# Patient Record
Sex: Female | Born: 1960 | Race: White | Hispanic: No
Health system: Midwestern US, Community
[De-identification: ages and names within clinical notes are randomized; demographics above are authoritative.]

---

## 2011-02-08 ENCOUNTER — Inpatient Hospital Stay

## 2011-02-15 ENCOUNTER — Inpatient Hospital Stay

## 2011-04-12 ENCOUNTER — Encounter

## 2011-04-13 ENCOUNTER — Encounter: Admit: 2011-04-13 | Discharge: 2011-04-13 | Payer: PRIVATE HEALTH INSURANCE

## 2012-06-12 DIAGNOSIS — R079 Chest pain, unspecified: Secondary | ICD-10-CM

## 2012-06-12 NOTE — Unmapped (Signed)
Pt to room 12 per squad with complaint of not feeling well today. Pt complains of nausea and feeling yucky. Pt states that she was laying in bed watching television and has a sudden (2 sec-per pt) pain from her right chest up to her jaw. Pt states that the pain was short but sharp and states that now she feels a little weird. Tired and nauseated.

## 2012-06-13 ENCOUNTER — Emergency Department: Admit: 2012-06-13 | Payer: PRIVATE HEALTH INSURANCE

## 2012-06-13 ENCOUNTER — Inpatient Hospital Stay
Admit: 2012-06-13 | Discharge: 2012-06-13 | Disposition: A | Discharge status: Home / Self Care | Payer: PRIVATE HEALTH INSURANCE

## 2012-06-13 LAB — DIFFERENTIAL
Basophils Absolute: 32 /uL (ref 0–200)
Basophils Relative: 0.4 % (ref 0.0–1.0)
Eosinophils Absolute: 405 /uL (ref 15–500)
Eosinophils Relative: 5 % (ref 0.0–8.0)
Lymphocytes Absolute: 3272 /uL (ref 850–3900)
Lymphocytes Relative: 40.4 % (ref 15.0–45.0)
Monocytes Absolute: 761 /uL (ref 200–950)
Monocytes Relative: 9.4 % (ref 0.0–12.0)
Neutrophils Absolute: 3629 /uL (ref 1500–7800)
Neutrophils Relative: 44.8 % (ref 40.0–80.0)

## 2012-06-13 LAB — BASIC METABOLIC PANEL
Anion Gap: 14 mmol/L (ref 3–16)
BUN: 18 mg/dL (ref 7–25)
CO2: 25 mmol/L (ref 21–33)
Calcium: 9.3 mg/dL (ref 8.6–10.2)
Chloride: 104 mmol/L (ref 98–110)
Creatinine: 0.8 mg/dL (ref 0.50–1.20)
GFR MDRD Af Amer: 91 See note.
GFR MDRD Non Af Amer: 75 See note.
Glucose: 103 mg/dL (ref 65–99)
Osmolality, Calculated: 298 mOsm/kg (ref 278–305)
Potassium: 4.2 mmol/L (ref 3.5–5.3)
Sodium: 143 mmol/L (ref 135–146)

## 2012-06-13 LAB — TROPONIN I 0 HOUR: Troponin I: 0.02 ng/mL (ref 0.00–0.05)

## 2012-06-13 LAB — CBC
Hematocrit: 40.8 % (ref 35.0–45.0)
Hemoglobin: 13.8 g/dL (ref 11.7–15.5)
MCH: 29.8 pg (ref 27.0–33.0)
MCHC: 33.8 g/dL (ref 32.0–36.0)
MCV: 88.3 fL (ref 80.0–100.0)
MPV: 7.1 fL (ref 7.5–11.5)
Platelets: 298 10*3/uL (ref 140–400)
RBC: 4.62 10*6/uL (ref 3.80–5.10)
RDW: 13.4 % (ref 11.0–15.0)
WBC: 8.1 10*3/uL (ref 3.8–10.8)

## 2012-06-13 LAB — TROPONIN I: Troponin I: <0.01 ng/mL (ref 0.00–0.05)

## 2012-06-13 LAB — NTPROBNP: NT Pro BNP: 124 pg/mL

## 2012-06-13 LAB — TROPONIN I 90 MIN: Troponin I: 0.03 ng/mL (ref 0.00–0.05)

## 2012-06-13 MED ORDER — ondansetron (ZOFRAN) 4 mg/2 mL injection 4 mg
4 | Freq: Once | INTRAMUSCULAR | Status: AC | PRN
Start: 2012-06-13 — End: 2012-06-13
  Administered 2012-06-13: 05:00:00 via INTRAVENOUS

## 2012-06-13 MED ORDER — aspirin tablet 325 mg
325 | Freq: Once | ORAL | Status: AC
Start: 2012-06-13 — End: 2012-06-13
  Administered 2012-06-13: 05:00:00 via ORAL

## 2012-06-13 MED ORDER — sodium chloride 0.9 % 500 mL bolus
Freq: Once | INTRAVENOUS | Status: AC
Start: 2012-06-13 — End: 2012-06-13
  Administered 2012-06-13: 05:00:00 via INTRAVENOUS

## 2012-06-13 MED ORDER — morphine injection 4 mg
4 | Freq: Once | INTRAMUSCULAR | Status: AC | PRN
Start: 2012-06-13 — End: 2012-06-13
  Administered 2012-06-13: 05:00:00 via INTRAVENOUS

## 2012-06-13 MED ORDER — nitroGLYCERIN (NITRO-BID) 2 % ointment 1 inch
2 | Freq: Once | TRANSDERMAL | Status: AC
Start: 2012-06-13 — End: 2012-06-13
  Administered 2012-06-13: 05:00:00 via TOPICAL

## 2012-06-13 MED ORDER — nitroGLYCERIN (NITROSTAT) SL tablet 0.4 mg
0.4 | SUBLINGUAL | Status: AC | PRN
Start: 2012-06-13 — End: 2012-06-13
  Administered 2012-06-13: 05:00:00 via SUBLINGUAL

## 2012-06-13 MED FILL — NITROSTAT 0.4 MG SUBLINGUAL TABLET: 0.4 0.4 mg | SUBLINGUAL | Qty: 1

## 2012-06-13 MED FILL — ONDANSETRON HCL (PF) 4 MG/2 ML INJECTION SOLUTION: 4 4 mg/2 mL | INTRAMUSCULAR | Qty: 2

## 2012-06-13 MED FILL — MORPHINE 4 MG/ML INJECTION SYRINGE: 4 4 mg/mL | INTRAMUSCULAR | Qty: 1

## 2012-06-13 MED FILL — SODIUM CHLORIDE 0.9 % INTRAVENOUS SOLUTION: INTRAVENOUS | Qty: 500

## 2012-06-13 MED FILL — NITRO-BID 2 % TRANSDERMAL OINTMENT: 2 2 % | TRANSDERMAL | Qty: 1

## 2012-06-13 MED FILL — ASPIRIN 325 MG TABLET: 325 325 MG | ORAL | Qty: 1

## 2012-06-13 NOTE — Unmapped (Signed)
Follow up with your PCP this week. You need to have a stress test as an outpatient. If chest pain returns or becomes more severe or if you have more trouble with your breathing or other concerns, please return to the ER.

## 2012-06-13 NOTE — Unmapped (Signed)
C/O discomfort in left upper chest that increase with movement of left arm- rate 3/10

## 2012-06-13 NOTE — Unmapped (Signed)
No change in pain after Nitro SL- states the pain increases slightly with deep breath

## 2012-06-13 NOTE — Unmapped (Signed)
Christie Short ED Note    Date of Service: 06/12/2012    Reason for Visit: Chest Pain      Patient History     HPI: 51 year old female with a recent history of esophageal problems, for which she is currently seeing a gastroenterologist, presents emergency room and complaining of chest pain.  The patient said that she was laying in bed when she had a sudden onset of severe sharp left-sided chest pain that radiated up to her left jaw.  It lasted for a couple of seconds and resolves spontaneously.  Since then, the patient has had residual tightness in the left side of her chest wrapping around toward her left back.  She rates this discomfort as a level of 3/10.  She feels like her breathing is a little bit tight, but not like her asthma.  This breathing abnormality has only been going on since she 1st noticed the pain.  The patient denies having any palpitations.  Denies any lightheadedness or syncope.  The patient says that she's had mild nausea associated all day long, preceding these chest pain symptoms.           Past Medical History   Diagnosis Date   ??? Anxiety        Past Surgical History   Procedure Date   ??? Hysterectomy    ??? Appendectomy    ??? Cholecystectomy    ??? Tubal ligation        Christie Short  reports that she has never smoked. She does not have any smokeless tobacco history on file. She reports that she drinks alcohol. She reports that she does not use illicit drugs.    Previous Medications    UNKNOWN TO PATIENT    Anxiety medication. PRN       Allergies:   Allergies as of 06/12/2012   ??? (No Known Allergies)       Review of Systems     ROS:  Please see HPI.  No lower extremity edema.  No calf tenderness.  No period of prolonged immobility, recent travel, recent surgery.  No cough or URI symptoms.  No fever.  No vomiting.  No abdominal pain. Other systems reviewed and negative.      Physical Exam     ED Triage Vitals   Vital Signs Group      Temp 06/12/12  2300 97.7 ??F (36.5 ??C)      Temp Source 06/12/12 2300 Oral      Heart Rate 06/12/12 2300 85       Heart Rate Source --       Resp 06/12/12 2300 18       BP 06/12/12 2300 171/96 mmHg      BP Location 06/12/12 2300 Right arm      BP Method 06/12/12 2300 Automatic      Patient Position 06/12/12 2300 Lying   SpO2 --    O2 Device --        General: Well-appearing female able to speak in complete sentences    HEENT: Head is normocephalic and atraumatic.  Pupils are equal round and reactive to light. Extraocular muscles are intact. Mucous membranes are moist.    Neck: supple.    Pulmonary:  Equal rise and fall the chest wall. No accessory muscle use.  Lungs are clear to auscultation bilaterally.  No wheezes rales or rhonchi.    Cardiac:  Regular rate and rhythm.  No murmurs rubs or gallops.    Abdomen:  Soft nontender.  No organomegaly.  No costovertebral angle tenderness.  Normal active bowel sounds.    Musculoskeletal: normal range of motion throughout bilateral upper and lower extremities.    Vascular:  2+ peripheral pulses bilaterally upper and lower extremities.    Skin: warm and dry.  No rashes.    Neuro:  Cranial nerves II through XII are grossly intact.  Sensation is intact to light touch in bilateral upper and lower extremities.  Strength is 5 out 5 bilaterally in upper and lower extremities.  Patient is observed to have a normal gait with no ataxia.    Psych: normal mood and affect.      Diagnostic Studies     I reviewed all studies done today.    Labs:    Please see electronic medical record for any tests performed in the ED .     Radiology:    Please see electronic medical record for any tests performed in the ED    EKG:    Indication chest pain, Rate 80 to, Rhythm sinus and normal, Interval normal and Axis normal.  No old EKG available for comparison.  Interpretation: Normal ECG.    Emergency Department Procedures         ED Course and MDM     Christie Short is a 51 y.o. female who presented to the emergency  department with Chest Pain    I reviewed all ED nursing notes.    The patient had a normal ECG.  Her chest x-ray and laboratory evaluation were unremarkable.  This included cardiac markers at time 0.0 and 3 hours with no interval change.  Given the patient's risk factor profile and her description of symptoms, I feel that the patient is safe for additional risk stratification as an outpatient.  She is scheduled to have an EGD in 2 days.  She is aware that she needs to have a stress test as well.    The patient has no risk factors for thromboembolic event.  My suspicion for infectious etiology of symptoms is low.  The patient is alert he had cholecystectomy and therefore my suspicion for biliary etiology of symptoms is low.    DISCHARGE DIAGNOSIS:  1. Chest pain        DISCHARGE MEDICATIONS:  New Prescriptions    No medications on file       (Please note the MDM and HPI sections of this note were completed with a voice recognition program)      Critical Care Time         Loralie Champagne, MD  06/13/12 [phone]

## 2012-06-13 NOTE — Unmapped (Signed)
Pt ambulatory with steady gait.

## 2015-03-22 ENCOUNTER — Ambulatory Visit: Payer: PRIVATE HEALTH INSURANCE

## 2015-03-22 ENCOUNTER — Inpatient Hospital Stay: Payer: PRIVATE HEALTH INSURANCE

## 2015-03-26 ENCOUNTER — Inpatient Hospital Stay: Admit: 2015-03-26 | Payer: PRIVATE HEALTH INSURANCE

## 2015-03-26 DIAGNOSIS — M4712 Other spondylosis with myelopathy, cervical region: Secondary | ICD-10-CM

## 2015-03-26 DIAGNOSIS — M47896 Other spondylosis, lumbar region: Secondary | ICD-10-CM

## 2017-10-11 ENCOUNTER — Inpatient Hospital Stay: Admit: 2017-10-11 | Payer: PRIVATE HEALTH INSURANCE | Attending: Medical

## 2017-10-11 ENCOUNTER — Ambulatory Visit: Admit: 2017-10-11 | Discharge: 2017-10-11 | Payer: PRIVATE HEALTH INSURANCE | Attending: Medical

## 2017-10-11 DIAGNOSIS — M5136 Other intervertebral disc degeneration, lumbar region: Secondary | ICD-10-CM

## 2017-10-11 DIAGNOSIS — M545 Low back pain, unspecified: Secondary | ICD-10-CM

## 2017-10-11 MED ORDER — methylPREDNISolone (MEDROL, PAK,) 4 mg tablet
4 | PACK | ORAL | 0 refills | Status: AC
Start: 2017-10-11 — End: ?

## 2017-10-11 NOTE — Unmapped (Signed)
Chief Complaint   Patient presents with   ??? Establish Care     Back pain       HPI:      Christie Short is a 57 y.o. female who presents for evaluation of low back pain and left buttock pain since December. States she has had back pain off and on for the past few months but it has worsened since December. Has noticed tingling in her left groin for the past couple days. Denies any leg pain. States she has tried massage which has helped. She started to notice the pain in the fall while golfing. 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 She has started swimming 2 weeks ago which has improved her left buttock pain/tingling. States the pain is constant and is worse with increased activity. The pain has not prevented her from doing activities but she notices increased pain at night. She has not tried PT. MRI of lumbar spine done in 2016 showed mild degenerative changes without neural compression.     Denies saddle anesthesia or loss of bowel or bladder.     Past Medical History:   Diagnosis Date   ??? Anxiety    ??? Asthma        Past Surgical History:   Procedure Laterality Date   ??? APPENDECTOMY     ??? CHOLECYSTECTOMY     ??? HYSTERECTOMY     ??? TUBAL LIGATION          History reviewed. No pertinent family history.    Medication  Current Outpatient Prescriptions   Medication Sig Dispense Refill   ??? nitrofurantoin (MACRODANTIN) 50 MG capsule Take by mouth.     ??? citalopram (CELEXA) 10 MG tablet Take by mouth.     ??? esomeprazole (NEXIUM) 20 MG capsule Take by mouth.     ??? estradiol 1.25 gram/actuation topical gel Place onto the skin.     ??? methylPREDNISolone (MEDROL, PAK,) 4 mg tablet follow package directions 21 Package 0   ??? UNKNOWN TO PATIENT Anxiety medication. PRN     ??? zolpidem (AMBIEN) 10 mg tablet        No current facility-administered medications for this visit.        Allergies  No Known Allergies    Review of Systems:  Pertinent items are noted in HPI.  A complete 10 system ROS was reviewed and was  otherwise negative for pertinent issues.    Physical Examination:    This is a pleasant female, alert, and in no acute distress.    Vitals:    10/11/17 0916   Weight: 180 lb (81.6 kg)   Height: 5' 5 (1.651 m)       Psychiatric:   The patient's mood is normal and appropriate for their visit     Musculoskeletal:     Lumbar Spine:  Gait:normal gait  Inspection and palpation: inspection of back is normal, no tenderness noted.  ROM exam: full range of motion with pain.  Straight leg raise: negative   Sensation is normal  Strength in LE: 5/5    Left HIP:  Logroll: negative  Tenderness: None  5/5 strength: HF/HE, Quad, Hamstring, TA, G/S  Sensation intact to light touch DP/SP/Sap/Sur/Tibial    Vascular exam:   Extremities warm and well perfused, no significant edema    Respiratory exam: Breathing easy and unlabored    Neuro exam:   No focal neuro deficits    Lymphatic:  No obvious lymphadenopathy    Skin:  Warm, dry    Radiology:  X-ray of lumbar spine showed dextro curvature with multilevel degenerative disc disease, moderate L3-4.    Assessment:     1. Lumbar spine pain  X-ray Lumbar spine 2 or 3-views    methylPREDNISolone (MEDROL, PAK,) 4 mg tablet    Physical Therapy       Plan:    Christie Short is a 57 y.o. female with low back and left buttock pain/tingling. Will prescribe medrol dosepak and order PT. If no improvement in 4 weeks then will plan on ordering MRI of lumbar spine for further evaluation.     Follow-up: after MRI if needed.  Sooner with any problems, questions, concerns, or worsening symptoms.    Toney Sang PA-C

## 2018-01-19 ENCOUNTER — Ambulatory Visit: Admit: 2018-01-19 | Discharge: 2018-01-19 | Payer: PRIVATE HEALTH INSURANCE | Attending: Sports Medicine

## 2018-01-19 ENCOUNTER — Inpatient Hospital Stay: Admit: 2018-01-19 | Payer: PRIVATE HEALTH INSURANCE | Attending: Sports Medicine

## 2018-01-19 DIAGNOSIS — M25572 Pain in left ankle and joints of left foot: Secondary | ICD-10-CM

## 2018-01-19 NOTE — Progress Notes (Signed)
MRI LEFT ANKLE SCHED AT [facility] [place] ON 01/22/18 AT 445PM - PATIENT REQUESTED LOCATION DUE TO COST - NO PRECERT REQUIRED

## 2018-01-19 NOTE — Progress Notes (Signed)
CHIEF COMPLAINT  Chief Complaint   Patient presents with    Establish Care     left foot/ankle     HISTORY OF PRESENT ILLNESS  Christie Short is a 57 y.o. female who presents today for evaluation of left foot pain.     Chronic lateral ankle pain that has mildly worsened recently. Patient swims and has bought shoe insert with reflexology points. No improvement in symptoms. Patient's symptoms are worsened after prolonged rest.     The symptoms have been present for 1 year(s).  The problem was a(n) gradual and insidious onset.    The patient did not have a specific injury.    The pain is moderate. The patient describes the pain as aching.  Current symptoms include: pain at the lateral aspect of the ankle  The following worsen the problem: walking  and weight bearing  The following improve the problem: OTC NSAIDS  Previous foot injury: no    Goals for recovery: pain reduction     PAST MEDICAL/SURGICAL HISTORY   The patient's past medical history, past surgical history, social history, medications, and allergies were reviewed and are documented in the patient's chart.      REVIEW OF SYSTEMS   Pertinent items are noted in HPI. A complete 10-system ROS was reviewed and was otherwise negative for pertinent issues.    PHYSICAL EXAMINATION  Vitals:    01/19/18 1237   Weight: 180 lb (81.6 kg)   Height: 5' 5 (1.651 m)    Body mass index is 29.95 kg/m.    Physical examination reveals a healthy-appearing 57 y.o. female in no acute distress. She has symmetric pulses and light touch sensation in her bilateral extremities.   There is not atrophy.  There is not bruising.  There is not deformity.  There is not swelling.  Skin inspection reveals: no skin or nail lesions; no thickening, discoloration or pitting  Overall foot inspection reveals: increased pronation on L   Rear examination of the foot reveals negative hindfoot valgus.  her gait was evaluated and found to have a normal gait.    Examination of the bilateral ankles reveals full  range of motion and strength. There are no areas of tenderness. The instability examination is negative.     Examination of the unaffected foot reveals full range of motion and strength. There are no areas of tenderness. The instability examination is negative.     There is tenderness to palpation over the peroneal tendon     Examination of the range of motion of the affected foot reveals:  - Normal toe flexion  - Normal toe extension  - Normal toe abduction    Strength examination of the affected foot reveals:   - Toe flexion: 5/5  - Toe extension: 5/5    IMAGING  AP/ LAT/ MORTISE VIEWS were obtained of the left foot. They were reviewed independently and discussed with the patient. They reveal no arthritic changes. There are not fracture(s) noted.    ASSESSMENT/PLAN  1. Pain in left ankle and joints of left foot  X-ray Foot Left min 3-views    X-ray Ankle Left min 3-views    MRI Ankle Left WO     We reviewed the above diagnosis with Olegario Messier today. her symptoms are most consistent with peroneal tendonitis vs longitudinal tendon tear. Possible cuboid stress fx. Will obtain MRI to further evaluate. Patient will likely require boot following imaging.    The treatment options were discussed in detail. The decision was  made to go forward with  - MRI L foot     Follow-up: in 1 week(s)  If there are any questions prior to this, the patient was instructed to contact the office.    Roque Cash DO   PGY4 ER SM Fellow

## 2018-01-19 NOTE — Unmapped (Signed)
This office note has been dictated.

## 2018-01-22 ENCOUNTER — Encounter: Attending: Sports Medicine

## 2018-01-23 ENCOUNTER — Ambulatory Visit: Admit: 2018-01-23 | Discharge: 2018-01-23 | Payer: PRIVATE HEALTH INSURANCE | Attending: Sports Medicine

## 2018-01-23 DIAGNOSIS — M25572 Pain in left ankle and joints of left foot: Secondary | ICD-10-CM

## 2018-01-23 NOTE — Unmapped (Signed)
This office note has been dictated.

## 2018-01-27 NOTE — Unmapped (Signed)
[facility]     PATIENT NAME:  Christie Short, Christie Short                    MRN:  [id number]  DATE OF BIRTH:  12-15-1960                       CSN:  [id number]  PROVIDER:  Lysbeth Galas, M.D.                VISIT DATE:  01/23/2018                                       OFFICE NOTE     CHIEF COMPLAINT:  Left ankle pain.     Frieda returns for evaluation. Her MRI showed no tears in the peroneus brevis  longus tendon. She does have some significant tenosynovitis, but no tear. 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 Not really any pain and I think she  has a relatively pes cavus foot and that is why she does not really feel that.     PHYSICAL EXAMINATION: On physical examination, you cannot palpate this at  all.  What we do see on her physical examination though is her peroneal  tendon is continuing to sublux.     We are going to have her follow-up with Dr. Durwin Nora to address the subluxation  of her peroneal tendon as well as the hemangioma.                                              Lysbeth Galas, M.D.  SWD/pd  D:  01/26/2018 08:11  T:  01/27/2018 16:01  Job #:  [id number]           OFFICE NOTE                                                  PAGE    1 of   1

## 2018-01-30 ENCOUNTER — Ambulatory Visit: Admit: 2018-01-30 | Discharge: 2018-01-30 | Payer: PRIVATE HEALTH INSURANCE

## 2018-01-30 DIAGNOSIS — M25572 Pain in left ankle and joints of left foot: Secondary | ICD-10-CM

## 2018-01-30 NOTE — Progress Notes (Signed)
CC: left ankle pain    HPI:      Christie Short is a 57 y.o. female who presents to me for the first time for evaluation of left ankle pain, ongoing for >1 year now. She denies any acute injury or trauma. However, patient [place] and has been walking on the sides of her feet walking uphill and mowing the lawn, suspects she might have injured her foot at some point. The ankle pain had mostly been occurring when she was off of her ankle, but lately she has been having throbbing pain with weightbearing. Still, the ankle pain at rest > pain with weightbearing. She also reports stabbing pain in her left ankle. She has taken Tylenol and advil with moderate relief. Recently, her left ankle started popping ~1 month ago with some relief after the popping but some burning pain also. She hiked this past weekend in boots. Patient is also active with swimming.     Patient reports she has rolled her right ankle numerous times. She is referred to me by Dr. Rubye Oaks.      Past Medical History:   Diagnosis Date    Anxiety     Asthma        Past Surgical History:   Procedure Laterality Date    APPENDECTOMY      CHOLECYSTECTOMY      HYSTERECTOMY      TUBAL LIGATION          No family history on file.    Medication  Current Outpatient Prescriptions   Medication Sig Dispense Refill    citalopram (CELEXA) 10 MG tablet Take by mouth.      esomeprazole (NEXIUM) 20 MG capsule Take by mouth.      estradiol 1.25 gram/actuation topical gel Place onto the skin.      methylPREDNISolone (MEDROL, PAK,) 4 mg tablet follow package directions 21 Package 0    nitrofurantoin (MACRODANTIN) 50 MG capsule Take by mouth.      UNKNOWN TO PATIENT Anxiety medication. PRN      zolpidem (AMBIEN) 10 mg tablet        No current facility-administered medications for this visit.        Allergies  No Known Allergies    Review of Systems:  The patient's medical history and review of systems was completed as part of her intake paperwork.   These forms were reviewed with the patient during the visit today, and have been scanned into electronic medical record.  Pertinent items are noted in HPI.     Physical Examination:    This is a pleasant female, alert, and in no acute distress.    Vitals:    01/30/18 0958   Weight: 170 lb (77.1 kg)   Height: 5' 5 (1.651 m)       Psychiatric:    The patient's mood is normal and appropriate for their visit      Vascular exam:    Extremities warm and well perfused, no significant edema      Palpable DP and PT pulses.     Respiratory exam:  Breathing easy and unlabored    Lymphatic:   No obvious lymphadenopathy    Skin:      Warm, dry    Gait:    Cautious gait on left, able to toe and heel walk     Hindfoot alignment:  Subtle cavus    MSK:    Mild tenderness over left peroneals, no  gross instability of peroneals, no significant discomfort with passive inversion/eversion.     Neuro exam:    Ankle dorsiflexion, plantarflexion, inversion, and eversion are intact.  Sensation intact to deep peroneal, superficial peroneal, sural, saphenous, and tibial nerve distributions.             Radiology:  MRI images of the left ankle dated 01/22/18 were reviewed independently and discussed with the patient.  The films revealed:   1. Supra- to inframalleolar peroneus brevis and longus tenosynovitis, without tear.  2. 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  3. Plantar calcaneal spur with high-grade stress reaction.  Low-grade stress reaction in the plantar aspect of the calcaneus, without fracture, however no evidence of active plantar fasciitis or tear.        Assessment:      1. Pain in left ankle and joints of left foot          Plan:     Christie Short is a 57 y.o. female with left ankle pain secondary to ankle instability. We discussed the diagnosis and potential treatment options. We discussed surgical intervention if she continues to  subluxate and has popping. At this time, the patient would like to try conservative management with bracing and supportive footwear. Continue NSAIDs prn. The patient voices understanding and all of her questions were answered. Follow up prn, when she is ready for surgery. It was a pleasure meeting the patient today in clinic.       This note was scribed by Suann Larry, scribe, in the presence of Christie Aver, MD, who reviewed this chart and validates that it is accurate and all medical decision making was done by them.     I, Christie Aver, MD validate that this chart was completed under my supervision.

## 2018-11-26 ENCOUNTER — Ambulatory Visit: Admit: 2018-11-26 | Discharge: 2018-11-26 | Payer: PRIVATE HEALTH INSURANCE | Attending: Sports Medicine

## 2018-11-26 ENCOUNTER — Inpatient Hospital Stay: Admit: 2018-11-26 | Discharge: 2018-12-04 | Payer: PRIVATE HEALTH INSURANCE | Attending: Sports Medicine

## 2018-11-26 DIAGNOSIS — M7541 Impingement syndrome of right shoulder: Secondary | ICD-10-CM

## 2018-11-26 DIAGNOSIS — M19011 Primary osteoarthritis, right shoulder: Secondary | ICD-10-CM

## 2018-11-26 MED ORDER — naproxen (NAPROSYN) 500 MG tablet
500 | ORAL_TABLET | Freq: Two times a day (BID) | ORAL | 0 refills | Status: AC
Start: 2018-11-26 — End: ?

## 2018-11-26 MED ORDER — naproxen (NAPROSYN) 500 MG tablet
500 | ORAL_TABLET | Freq: Two times a day (BID) | ORAL | 0 refills | Status: AC
Start: 2018-11-26 — End: 2018-11-26

## 2018-11-26 NOTE — Unmapped (Signed)
-   Decrease lap count, but continue other cardio exercises  - Begin physical therapy  - Prescription sent for Naprosyn

## 2018-11-26 NOTE — Unmapped (Signed)
Chief Complaint   Patient presents with   ??? Establish Care     right shoulder pain       HPI:      Christie Short is a 58 y.o. female who presents for evaluation of right shoulder pain.  The patient is seen at the request of Referral, Self.      The notes and documentation from the physician extender were reviewed, verified, and edited if necessary.       Patient comes in for evaluation of right shoulder pain that has been present for months, no known injury or trauma. The pain is over the anterior right shoulder, she endorses some popping in her shoulder. She has pain while swimming, especially during backstroke and during the reach phase of a stroke. Using Advil with good relief. Patient is right hand dominant. She is also active with golfing. She moved in the last few months and lifted a lot of boxes during the move.      Past Medical History:   Diagnosis Date   ??? Anxiety    ??? Asthma        Past Surgical History:   Procedure Laterality Date   ??? APPENDECTOMY     ??? CHOLECYSTECTOMY     ??? HYSTERECTOMY     ??? TUBAL LIGATION          History reviewed. No pertinent family history.    Medication  Current Outpatient Medications   Medication Sig Dispense Refill   ??? citalopram (CELEXA) 10 MG tablet Take by mouth.     ??? esomeprazole (NEXIUM) 20 MG capsule Take by mouth.     ??? estradiol 1.25 gram/actuation topical gel Place onto the skin.     ??? methylPREDNISolone (MEDROL, PAK,) 4 mg tablet follow package directions 21 Package 0   ??? nitrofurantoin (MACRODANTIN) 50 MG capsule Take by mouth.     ??? UNKNOWN TO PATIENT Anxiety medication. PRN     ??? zolpidem (AMBIEN) 10 mg tablet        No current facility-administered medications for this visit.        Allergies  No Known Allergies    Review of Systems:  Pertinent items are noted in HPI.  A complete 10 system ROS was reviewed and was otherwise negative for pertinent issues.    Physical Examination:    This is a pleasant female, alert, and in no acute distress.    Vitals:    11/26/18  1350   Weight: 170 lb (77.1 kg)   Height: 5' 5 (1.651 m)       Psychiatric:   The patient's mood is normal and appropriate for their visit     Right shoulder:  Full ROM with pain at > 90 elevation and abduction, +Hawkins', +Cross arm, decreased strength with rotator cuff testing, equivocal O'Brien's     Vascular exam:   Extremities warm and well perfused, no significant edema    Respiratory exam: Breathing easy and unlabored    Neuro exam:   No focal neuro deficits    Lymphatic:  No obvious lymphadenopathy    Skin:     Warm, dry    Radiology:     4 view X-rays of the right shoulder dated 11/26/18 were reviewed independently and discussed with the patient.  The films revealed: No acute osseous abnormalities, Moderate acromioclavicular joint arthrosis.    Assessment:     1. Right shoulder pain, unspecified chronicity  X-ray Shoulder Right min 2-views  Plan:    INIS BORNEMAN is a 58 y.o. female with right shoulder pain consistent with impingement. We discussed the diagnosis and potential treatment options. Referral to physical therapy for rotator cuff strengthening. Rx sent for naprosyn. The patient voices understanding and all of her questions were answered.    Discussed the patient's diagnosis, exam, imaging (if applicable), and treatment options:  [x]   Discussed return to activity considerations, including the need to avoid exacerbating activity, high impact, or painful activity  [x]   Discussed the use of pain medication and nonsteroidal anti-inflammatory agents including the risk of side effects which include but not limited to:  the risk of increased blood pressure, bleeding, renal, gastrointestinal, and heart involvement.     [x]  Patient to consider ibuprofen or naprosyn for 1-2 weeks with food.     [x]  The patient can also consider acetaminophen for additional pain if needed.    [x]  Prescription given for pain medication or anti-inflammatory (see Epic orders)   []  May continue on their previously prescribed  pain regimen   [x]   Ice 3-4 x/day for 15-20 min and after activity  []   Consider glucosamine supplementation - 1500 mg/day for 2-3 months    []   Weight management   []   Consider brace  [x]   Reviewed the role of a rehabilitative exercise program:   [x]  Patient elected for a referral to physical therapy     []  Patient elected for a home based program and instruction was given in the office today  [x]   Reviewed the role of additional diagnostic and treatment options including the role of further imaging, injection therapy, and possible surgical considerations.      Follow-up: 6 weeks.  Sooner with any problems, questions, concerns, or worsening symptoms.      [x]   A copy of my note was sent electronically to the referring provider through our shared medical record or automated fax system.    This note was scribed by Sandy Salaam, scribe, in the presence of Christie Patter, MD who reviewed this chart and validates that it is accurate and all medical decision making was done by them.     I, Christie Patter, MD validate that this chart was completed under my supervision.     The scribe???s documentation has been prepared under my direction and personally reviewed by me in its entirety. I confirm that the note above accurately reflects all work, treatment, procedures, and medical decision making performed by me.

## 2019-01-14 ENCOUNTER — Encounter: Payer: PRIVATE HEALTH INSURANCE | Attending: Sports Medicine

## 2019-01-31 NOTE — Unmapped (Signed)
Ok to cancel.  Thanks

## 2019-01-31 NOTE — Unmapped (Signed)
Talk to pt regarding rescheduling.she said she didn't went to her physical therapy but she is doing well. Her shoulder hasn't been bothering her lately. She would like to know if she need to be seen or not.

## 2019-05-01 ENCOUNTER — Ambulatory Visit
Admit: 2019-05-01 | Discharge: 2019-05-01 | Payer: PRIVATE HEALTH INSURANCE | Attending: Female Pelvic Medicine and Reconstructive Surgery

## 2019-05-01 DIAGNOSIS — Z87440 Personal history of urinary (tract) infections: Secondary | ICD-10-CM

## 2019-05-01 LAB — POCT URINALYSIS DIPSTICK
Bilirubin, UA: NEGATIVE
Blood, UA POC: NEGATIVE
Glucose, UA POC: NEGATIVE
Ketones, UA: NEGATIVE
Leukocytes, UA: NEGATIVE
Nitrite, UA: NEGATIVE
Protein, UA POC: NEGATIVE
Spec Grav, UA: 1.005
Urobilinogen, UA: NEGATIVE
pH, UA: 7.5

## 2019-05-01 MED ORDER — ESTROGENS, CONJUGATED 0.625 MG/GM VA CREA
0.625 MG/GM | VAGINAL | 0 refills | Status: DC
Start: 2019-05-01 — End: 2021-05-31

## 2019-05-01 NOTE — Telephone Encounter (Signed)
Faxed PT referral 3:27 pm 05/01/19    JB

## 2019-05-01 NOTE — Progress Notes (Signed)
05/01/2019       HPI:     Name: Diane Woodward  Date of Birth: 07-30-1961    CC: Patient is a 58 y.o. presenting for evaluation of frequent UTI by Dr Juliene Pina.  HPI: How long have you had this problem?  Years, worse the last 8 weeks   Please rate the severity of your problem: moderately severe  Anything make it better? Advil, tylenol, OTC ASO     She has been using macrobid for prophylaxis after intercourse, now she has pressure and pain I the pelvic area.  She did have a recent scan at [facility] that was normal according to the patient.  The pain would wake her up in the middle of the night.  Intercourse and alcohol make it worse.    CT from 2015  CT ABDOMEN AND PELVIS WITH CONTRAST:    CLINICAL INDICATION: N fraction    TECHNIQUE: Routine abdomen/pelvis CT with intravenous contrast. MIP urograms performed.. Oral contrast was administered.    COMPARISON: None    FINDINGS:    The lung bases are clear. Heart size normal without pericardial effusion.  The liver is unremarkable, without focal lesion. The spleen is not enlarged. There is no focal splenic lesion. The pancreas is unremarkable, without ductal dilatation.    The patient is status post cholecystectomy. The adrenals are within normal limits. The kidneys are within normal limits. No suspicious renal lesion is identified.    There is a faint area of high density 7 x 3 mm in the mid left kidney which could represent faint calcifications within a calyx or papilla.    There is no free pelvic fluid. There is no evidence of lymphadenopathy.    Bowel is grossly unremarkable. 6.4 cm low-attenuation rounded mass left adnexa.    Bone windows demonstrate no lytic or blastic lesion.    IMPRESSION      Mid left kidney there is a faint ill-defined area of high density 7 x 3 mm. It could represent faint calcifications within a calyx or patella.    No evidence of obstructive uropathy. Collecting systems normal. No renal mass lesions.    6.4 cm low-attenuation mass left adnexa.  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 Consider pelvic ultrasound for followup.   Ob/Gyn History:    OB History   No obstetric history on file.     Past Medical History: No past medical history on file.  Past Surgical History:   Past Surgical History:   Procedure Laterality Date   ??? APPENDECTOMY  2014   ??? BREAST REDUCTION SURGERY  07/2018   ??? CHOLECYSTECTOMY  2012   ??? HYSTERECTOMY, TOTAL ABDOMINAL      has had 3 seperate surgerys    ??? INCONTINENCE SURGERY  07/2005     Allergies: Allergies not on file  Current Medications:  Current Outpatient Medications   Medication Sig Dispense Refill   ??? conjugated estrogens (PREMARIN) 0.625 MG/GM vaginal cream Apply pea size amount of cream with index finger to the vagina two times per week 42.5 g 0     No current facility-administered medications for this visit.      Social History:   Social History     Socioeconomic History   ??? Marital status: Divorced     Spouse name: Not on file   ??? Number of children: Not on file   ??? Years of education: Not on file   ??? Highest education level: Not on file  Occupational History   ??? Not on file   Social Needs   ??? Financial resource strain: Not on file   ??? Food insecurity     Worry: Not on file     Inability: Not on file   ??? Transportation needs     Medical: Not on file     Non-medical: Not on file   Tobacco Use   ??? Smoking status: Never Smoker   ??? Smokeless tobacco: Never Used   Substance and Sexual Activity   ??? Alcohol use: Yes     Frequency: Monthly or less   ??? Drug use: Never   ??? Sexual activity: Yes     Partners: Male   Lifestyle   ??? Physical activity     Days per week: Not on file     Minutes per session: Not on file   ??? Stress: Not on file   Relationships   ??? Social Wellsite geologistconnections     Talks on phone: Not on file     Gets together: Not on file     Attends religious service: Not on file     Active member of club or organization: Not on file     Attends meetings of clubs or organizations: Not on file      Relationship status: Not on file   ??? Intimate partner violence     Fear of current or ex partner: Not on file     Emotionally abused: Not on file     Physically abused: Not on file     Forced sexual activity: Not on file   Other Topics Concern   ??? Not on file   Social History Narrative   ??? Not on file     Family History: No family history on file.  Review of System   Review of Systems   Eyes: Positive for itching.   Gastrointestinal: Positive for nausea.   Genitourinary: Positive for dysuria, pelvic pain and vaginal pain.   Allergic/Immunologic: Positive for environmental allergies.   Neurological: Positive for headaches.   All other systems reviewed and are negative.    POPQ and Pelvic Exam    Anterior Wall (Aa): 1 Anterior Wall (Ba): 1 Cervix or Cuff (C): 6   Genital Haitus (gh): 3 Perineal body (pb): 2 Total Vaginal Length (tvl): 9   Posterior Wall (Ap): 1 Posterior Wall (Bp): 1 Posterior Fornix (D): N/A       Objective:     Vitals  Vitals:    05/01/19 0910   BP: 136/87   Site: Left Upper Arm   Position: Sitting   Cuff Size: Medium Adult   Pulse: 74   Resp: 16   Temp: 96.7 ??F (35.9 ??C)   TempSrc: Temporal   SpO2: 98%   Weight: 175 lb (79.4 kg)   Height: 5\' 6"  (1.676 m)     Physical Exam  Physical Exam  HENT:      Head: Normocephalic and atraumatic.   Eyes:      Conjunctiva/sclera: Conjunctivae normal.   Neck:      Musculoskeletal: Normal range of motion and neck supple.   Pulmonary:      Effort: Pulmonary effort is normal.   Abdominal:      Palpations: Abdomen is soft.   Skin:     General: Skin is warm and dry.   Neurological:      Mental Status: She is alert and oriented to person, place, and time.  Results for POC orders placed in visit on 05/01/19   POCT Urinalysis no Micro   Result Value Ref Range    Color, UA Yellow     Clarity, UA Clear     Glucose, UA POC negative     Bilirubin, UA negative     Ketones, UA negative     Spec Grav, UA 1.005     Blood, UA POC negative     pH, UA 7.5     Protein, UA POC  negative     Urobilinogen, UA negative     Leukocytes, UA negative     Nitrite, UA negative    Office Fill Study/Urine Dip  Using sterile technique a manometry catheter was placed. Patient's bladder was filled with sterile water by gravity. Capacity and storage volumes were measured. Spasms assessed. Catheter was removed. Stress urinary incontinence was assessed.    WBC: negative  RBC: negative  Nitrate: negative    PVR: 10 cc  1st Sensation: 60 cc  2nd Sensation: 240 cc  Max Sensation: 310 cc  Empty Cough Stress Test: negative  Full Cough Stress Test: negative  Spasms: No    Assessment/Plan:     Diane Woodward is a 58 y.o. female with   1. History of recurrent UTI (urinary tract infection)    2. Vaginal atrophy    3. Dysuria    4. Levator spasm    5. Dyspareunia in female      By her history it sounds like she has had recurrent urinary tract infections.  She is treated herself with self start Macrobid therapy and occasionally has called in for other types of antibiotics for treatment.  Recently she is complained of this heaviness and pain in the pelvic floor area.  She does have some vaginal atrophy on examination, there is no sign of prolapse, and her pelvic floor muscles are tender.  She has agreed to try estrogen vaginal cream to follow-up with pelvic floor physical therapy, and to follow-up with me for cystourethroscopy in approximately 3 weeks.    Orders Placed This Encounter   Procedures   ??? POCT Urinalysis no Micro   ??? PR SIMPLE CYSTOMETROGRAM     Orders Placed This Encounter   Medications   ??? conjugated estrogens (PREMARIN) 0.625 MG/GM vaginal cream     Sig: Apply pea size amount of cream with index finger to the vagina two times per week     Dispense:  42.5 g     Refill:  0           Diane Woodward   I, Diane Woodward am scribing for and in the presence and direction of Dr. Tia MaskerSteven Julieana Eshleman.  05/01/2019   Rance MuirMeagan Moore, LPN  I, Dr. Tia MaskerSteven Doyle Tegethoff, personally performed the services described in this documentation  as scribed by the clinical staff in my presence, and it is both accurate and complete.

## 2019-05-29 ENCOUNTER — Encounter: Attending: Female Pelvic Medicine and Reconstructive Surgery

## 2019-06-26 ENCOUNTER — Encounter: Attending: Female Pelvic Medicine and Reconstructive Surgery

## 2020-10-26 ENCOUNTER — Ambulatory Visit: Admit: 2020-10-26 | Discharge: 2020-10-26 | Payer: PRIVATE HEALTH INSURANCE | Attending: Nurse Practitioner

## 2020-10-26 DIAGNOSIS — R3915 Urgency of urination: Secondary | ICD-10-CM

## 2020-10-26 LAB — POCT URINALYSIS DIPSTICK
Bilirubin, UA: NEGATIVE
Blood, UA POC: NEGATIVE
Glucose, UA POC: NEGATIVE
Ketones, UA: NEGATIVE
Leukocytes, UA: NEGATIVE
Nitrite, UA: NEGATIVE
Protein, UA POC: NEGATIVE
Spec Grav, UA: 1.015
Urobilinogen, UA: NEGATIVE
pH, UA: 6.5

## 2020-10-26 MED ORDER — NITROFURANTOIN MONOHYD MACRO 100 MG PO CAPS
100 MG | ORAL_CAPSULE | Freq: Every day | ORAL | 1 refills | Status: AC
Start: 2020-10-26 — End: 2020-11-23

## 2020-10-26 NOTE — Progress Notes (Signed)
Patient has been seen once on referral from her PCP for recurrent UTI's in 04/2019.  Advised to have cystoscopy.    Review of epic shows some reoccuring UTI messages to her PCP for treatment as she is often out of town at her home in [place].   10/05/20  (culture obtained)  08/07/20  12/31/19    Typically treated with Bactrim over the phone.     She is currently using topical vaginal estrogen sporadically and cranberry supplement sporadically

## 2020-10-26 NOTE — Progress Notes (Signed)
10/26/2020       HPI:     Name: Diane Woodward  Date of Birth: May 13, 1961    CC: Patient is a 60 y.o. presenting for evaluation of urine check for UTIs.   HPI: How long have you had this problem?  months  Please rate the severity of your problem: moderate  Anything make it better? Antibiotics     Patient has been seen once on referral from her PCP for recurrent UTI's in 04/2019.  Advised to have cystoscopy.    Review of epic shows some reoccuring UTI messages to her PCP for treatment as she is often out of town at her home in [place].   10/05/20  (culture obtained) Completed Keflex  08/07/20  12/31/19    Typically treated with Bactrim over the phone.     She is currently using topical vaginal estrogen sporadically and cranberry supplement sporadically    Feels she continues to get them with intercourse  Symptoms include U/F, hesitation of flow, dysuria          Ob/Gyn History:    OB History   No obstetric history on file.     Past Medical History: No past medical history on file.  Past Surgical History:   Past Surgical History:   Procedure Laterality Date   ??? APPENDECTOMY  2014   ??? BREAST REDUCTION SURGERY  07/2018   ??? CHOLECYSTECTOMY  2012   ??? HYSTERECTOMY, TOTAL ABDOMINAL      has had 3 seperate surgerys    ??? INCONTINENCE SURGERY  07/2005     Allergies: No Known Allergies  Current Medications:  Current Outpatient Medications   Medication Sig Dispense Refill   ??? acyclovir (ZOVIRAX) 400 MG tablet Take 400 mg by mouth 3 times daily     ??? amLODIPine (NORVASC) 2.5 MG tablet Take 2.5 mg by mouth daily     ??? citalopram (CELEXA) 10 MG tablet Take by mouth     ??? esomeprazole (NEXIUM) 20 MG delayed release capsule Take by mouth     ??? zolpidem (AMBIEN) 10 MG tablet      ??? nitrofurantoin, macrocrystal-monohydrate, (MACROBID) 100 MG capsule Take 1 capsule by mouth daily for 28 days 28 capsule 1   ??? Estradiol 0.75 MG/1.25 GM (0.06%) GEL Place onto the skin (Patient not taking: Reported on 10/26/2020)     ??? fexofenadine (ALLEGRA) 180 MG  tablet Take 180 mg by mouth daily (Patient not taking: Reported on 10/26/2020)     ??? fluconazole (DIFLUCAN) 150 MG tablet TAKE ONE TABLET BY MOUTH ONCE (Patient not taking: Reported on 10/26/2020)     ??? fluticasone (FLONASE) 50 MCG/ACT nasal spray 2 sprays by Nasal route daily (Patient not taking: Reported on 10/26/2020)     ??? olopatadine (PATANASE) 0.6 % SOLN nassl soln 1 spray by Nasal route daily (Patient not taking: Reported on 10/26/2020)     ??? conjugated estrogens (PREMARIN) 0.625 MG/GM vaginal cream Apply pea size amount of cream with index finger to the vagina two times per week (Patient not taking: Reported on 10/26/2020) 42.5 g 0     No current facility-administered medications for this visit.     Social History:   Social History     Socioeconomic History   ??? Marital status: Divorced     Spouse name: Not on file   ??? Number of children: Not on file   ??? Years of education: Not on file   ??? Highest education level: Not on file  Occupational History   ??? Not on file   Tobacco Use   ??? Smoking status: Never Smoker   ??? Smokeless tobacco: Never Used   Substance and Sexual Activity   ??? Alcohol use: Yes   ??? Drug use: Never   ??? Sexual activity: Yes     Partners: Male   Other Topics Concern   ??? Not on file   Social History Narrative   ??? Not on file     Social Determinants of Health     Financial Resource Strain:    ??? Difficulty of Paying Living Expenses: Not on file   Food Insecurity:    ??? Worried About Running Out of Food in the Last Year: Not on file   ??? Ran Out of Food in the Last Year: Not on file   Transportation Needs:    ??? Lack of Transportation (Medical): Not on file   ??? Lack of Transportation (Non-Medical): Not on file   Physical Activity:    ??? Days of Exercise per Week: Not on file   ??? Minutes of Exercise per Session: Not on file   Stress:    ??? Feeling of Stress : Not on file   Social Connections:    ??? Frequency of Communication with Friends and Family: Not on file   ??? Frequency of Social Gatherings with Friends  and Family: Not on file   ??? Attends Religious Services: Not on file   ??? Active Member of Clubs or Organizations: Not on file   ??? Attends Banker Meetings: Not on file   ??? Marital Status: Not on file   Intimate Partner Violence:    ??? Fear of Current or Ex-Partner: Not on file   ??? Emotionally Abused: Not on file   ??? Physically Abused: Not on file   ??? Sexually Abused: Not on file   Housing Stability:    ??? Unable to Pay for Housing in the Last Year: Not on file   ??? Number of Places Lived in the Last Year: Not on file   ??? Unstable Housing in the Last Year: Not on file     Family History: No family history on file.  Review of System   Review of Systems   Genitourinary: Positive for enuresis and frequency.   Allergic/Immunologic: Positive for environmental allergies.   All other systems reviewed and are negative.      A review of systems was done by the patient and reviewed by me.    Objective:     Vitals  Vitals:    10/26/20 1451   BP: (!) 151/82   Pulse: 78   Resp: 18   Temp: 97.2 ??F (36.2 ??C)   SpO2: 98%     Physical Exam  Physical Exam  Vitals and nursing note reviewed.   Constitutional:       Appearance: Normal appearance.   HENT:      Head: Normocephalic.   Eyes:      Conjunctiva/sclera: Conjunctivae normal.   Cardiovascular:      Rate and Rhythm: Normal rate.   Pulmonary:      Effort: Pulmonary effort is normal.   Musculoskeletal:         General: Normal range of motion.      Cervical back: Normal range of motion.   Skin:     General: Skin is warm and dry.   Neurological:      General: No focal deficit present.  Mental Status: She is alert.   Psychiatric:         Mood and Affect: Mood normal.         Behavior: Behavior normal.       Straight urinary catheterization performed by sterile procedure for urine specimen per Drema Balzarine, NP.  Patient tolerated well.  Drema Balzarine, NP made aware.       Results for POC orders placed in visit on 10/26/20   POCT Urinalysis no Micro   Result Value Ref Range     Color, UA yellow     Clarity, UA clear     Glucose, UA POC neg     Bilirubin, UA neg     Ketones, UA neg     Spec Grav, UA 1.015     Blood, UA POC neg     pH, UA 6.5     Protein, UA POC neg     Urobilinogen, UA neg     Leukocytes, UA neg     Nitrite, UA neg        Assessment/Plan:     Diane Woodward is a 60 y.o. female with   1. Urgency of urination    2. Recurrent UTI    3. Urinary frequency      -Records reviewed.  -Release of records obtained for recent Urine culture from Urgent care in [place]    -Recurrent UTI:  Diagnosis and management of recurrent UTI reviewed with patient.  Additional benefits of twice daily cranberry, d-mannose and methenamine discussed  Aware of role for daily or self-start antibiotics when cultures are reviewed  Informed how post and perimenopausal women are more susceptible to UTI's due to lack of estrogen    Understands the need for culture if symptomatic ??? outlined advantage of cath specimen  Also counseled on need for cystoscopy and patient will schedule  Patient to return here for UTI symptoms or have culture obtained.    -Macrobid sent to pharmacy for post coital use.  -she will be more consistent at use of vaginal estrogen and cranberry.     -Urine sent for culture today.    Follow up cysto 1-2 months  Carlena Bjornstad, APRN - CNP    Orders Placed This Encounter   Procedures   ??? Culture, Urine     Order Specific Question:   Specify (ex-cath, midstream, cysto, etc)?     Answer:   straight cath   ??? POCT Urinalysis no Micro   ??? 51701 - PR INSERT,NON-INDWELLING BLADDER CATHETER     Orders Placed This Encounter   Medications   ??? nitrofurantoin, macrocrystal-monohydrate, (MACROBID) 100 MG capsule     Sig: Take 1 capsule by mouth daily for 28 days     Dispense:  28 capsule     Refill:  1       Carlena Bjornstad, APRN - CNP

## 2020-10-29 LAB — CULTURE, URINE: Urine Culture, Routine: NO GROWTH

## 2020-12-21 ENCOUNTER — Ambulatory Visit: Admit: 2020-12-21 | Discharge: 2020-12-21 | Payer: PRIVATE HEALTH INSURANCE | Attending: Sports Medicine

## 2020-12-21 ENCOUNTER — Inpatient Hospital Stay: Admit: 2020-12-21 | Payer: PRIVATE HEALTH INSURANCE | Attending: Sports Medicine

## 2020-12-21 DIAGNOSIS — M7742 Metatarsalgia, left foot: Secondary | ICD-10-CM

## 2020-12-21 NOTE — Unmapped (Signed)
Chief Complaint   Patient presents with   ??? Foot Pain     left foot        HPI:      Christie Short is a 60 y.o. female with a history of left ankle instability (previously seen by Dr. Durwin Nora) who presents for evaluation of left foot pain. The patient states that the pain located at the bast of her 2nd toe onset about 1 month ago. She first noticed the pain when returning home in the evening from a game of golf. She notes that her 2nd and 3rd toes seem more separated than usual. She has occasionally been taking Aleve with some relief; otherwise she has not been doing anything else for pain.      The notes and documentation from the physician extender were reviewed, verified, and edited if necessary.    Social History:     The patient notes that she has been spending a lot of time in [place] and is renovating a home in [place]. She will be in [place] again in May.    Past Medical History:   Diagnosis Date   ??? Anxiety    ??? Asthma        Past Surgical History:   Procedure Laterality Date   ??? APPENDECTOMY     ??? CHOLECYSTECTOMY     ??? HYSTERECTOMY     ??? TUBAL LIGATION          History reviewed. No pertinent family history.    Medication  Current Outpatient Medications   Medication Sig Dispense Refill   ??? citalopram (CELEXA) 10 MG tablet Take by mouth.     ??? esomeprazole (NEXIUM) 20 MG capsule Take by mouth.     ??? estradiol 1.25 gram/actuation topical gel Place onto the skin.     ??? methylPREDNISolone (MEDROL, PAK,) 4 mg tablet follow package directions 21 Package 0   ??? naproxen (NAPROSYN) 500 MG tablet Take 1 tablet (500 mg total) by mouth 2 times a day with meals. 60 tablet 0   ??? nitrofurantoin (MACRODANTIN) 50 MG capsule Take by mouth.     ??? UNKNOWN TO PATIENT Anxiety medication. PRN     ??? zolpidem (AMBIEN) 10 mg tablet        No current facility-administered medications for this visit.       Allergies  No Known Allergies    Review of Systems:  Pertinent items are noted in HPI.  A complete 10 system ROS was reviewed and  was otherwise negative for pertinent issues.    Physical Examination:    This is a pleasant female, alert, and in no acute distress.    Vitals:    12/21/20 0831   Weight: 182 lb (82.6 kg)   Height: 5' 6 (1.676 m)       Psychiatric:   The patient's mood is normal and appropriate for their visit     Left foot exam: TTP over the 2nd metatarsal head. She has a slight hammertoe deformity. Neurovascularly intact.    Vascular exam:   Extremities warm and well perfused, no significant edema    Respiratory exam: Breathing easy and unlabored    Neuro exam:   No focal neuro deficits    Lymphatic:  No obvious lymphadenopathy    Skin:     Warm, dry    Radiology:     3 view X-rays of the left foot dated 12/21/20 were reviewed independently and discussed with the patient.  The films revealed:  no acute osseous abnormalities.    Assessment:     1. Metatarsalgia of left foot     2. Left foot pain  X-ray Foot Left min 3-views       Plan:     Christie Short is a 60 y.o. female with left second toe metatarsalgia. We discussed the diagnosis and potential treatment options. I recommended that she try a metatarsal pad; I recommended Powerstep pinnacle plus met insoles (which have a metatarsal pad built in). The patient voices understanding and all of her questions were answered.     Discussed the patient's diagnosis, exam, imaging (if applicable), and treatment options:  [x]   Discussed return to activity considerations, including the need to avoid exacerbating activity, high impact, or painful activity  [x]   Discussed the use of pain medication and nonsteroidal anti-inflammatory agents including the risk of side effects which include but not limited to:  the risk of increased blood pressure, bleeding, renal, gastrointestinal, and heart involvement.     [x]  Patient to consider ibuprofen or naprosyn for 1-2 weeks with food.     [x]  The patient can also consider acetaminophen for additional pain if needed.    []  Prescription medication  prescribed today (see Epic orders)   [x]  May continue on their previously prescribed medications  [x]   Ice 3-4 x/day for 15-20 min and after activity  []   Consider glucosamine supplementation - 1500 mg/day for 2-3 months    []   Weight management   []   Consider brace  [x]   Reviewed the role of a rehabilitative exercise program:   []  Patient elected for a referral to physical therapy     []  Patient elected for a home based program and instruction was given in the office today  [x]   Reviewed the role of additional diagnostic and treatment options including the role of further imaging, injection therapy, and possible surgical considerations.      Follow-up: 6 weeks to reassess.  Sooner with any problems, questions, concerns, or worsening symptoms.          This note was scribed by Milus Mallick , in the presence of Laverle Patter, MD, who reviewed this chart and validates that it is accurate and all medical decision making was done by them.     I, Laverle Patter, MD validate that this chart was completed under my supervision.     The scribe???s documentation has been prepared under my direction and personally reviewed by me in its entirety. I confirm that the note above accurately reflects all work, treatment, procedures, and medical decision making performed by me.

## 2020-12-24 ENCOUNTER — Encounter: Attending: Female Pelvic Medicine and Reconstructive Surgery

## 2021-01-26 ENCOUNTER — Encounter
Admit: 2021-01-26 | Discharge: 2021-01-26 | Payer: PRIVATE HEALTH INSURANCE | Attending: Female Pelvic Medicine and Reconstructive Surgery

## 2021-01-26 DIAGNOSIS — N390 Urinary tract infection, site not specified: Secondary | ICD-10-CM

## 2021-01-26 MED ORDER — NITROFURANTOIN MONOHYD MACRO 100 MG PO CAPS
100 MG | ORAL_CAPSULE | Freq: Two times a day (BID) | ORAL | 1 refills | Status: DC
Start: 2021-01-26 — End: 2022-01-17

## 2021-01-26 NOTE — Progress Notes (Signed)
01/26/2021     HPI:     Name: Diane Woodward  Date of Birth: August 02, 1961    CC: Diane Woodward is a 60 y.o. female presenting for an evaluation of urgency.  HPI: How long have you had this problem? months  Please rate the severity of your problem: moderate  Anything make it better?     Ob/Gyn History:    OB History   No obstetric history on file.     Past Medical History: History reviewed. No pertinent past medical history.  Past Surgical History:   Past Surgical History:   Procedure Laterality Date   ??? APPENDECTOMY  2014   ??? BREAST REDUCTION SURGERY  07/2018   ??? CHOLECYSTECTOMY  2012   ??? HYSTERECTOMY, TOTAL ABDOMINAL      has had 3 seperate surgerys    ??? INCONTINENCE SURGERY  07/2005     Current Medications:  Current Outpatient Medications   Medication Sig Dispense Refill   ??? nitrofurantoin, macrocrystal-monohydrate, (MACROBID) 100 MG capsule Take 1 capsule by mouth 2 times daily 20 capsule 1   ??? acyclovir (ZOVIRAX) 400 MG tablet Take 400 mg by mouth 3 times daily     ??? amLODIPine (NORVASC) 2.5 MG tablet Take 2.5 mg by mouth daily     ??? citalopram (CELEXA) 10 MG tablet Take by mouth     ??? esomeprazole (NEXIUM) 20 MG delayed release capsule Take by mouth     ??? Estradiol 0.75 MG/1.25 GM (0.06%) GEL Place onto the skin      ??? fexofenadine (ALLEGRA) 180 MG tablet Take 180 mg by mouth daily      ??? fluticasone (FLONASE) 50 MCG/ACT nasal spray 2 sprays by Nasal route daily      ??? olopatadine (PATANASE) 0.6 % SOLN nassl soln 1 spray by Nasal route daily      ??? zolpidem (AMBIEN) 10 MG tablet      ??? conjugated estrogens (PREMARIN) 0.625 MG/GM vaginal cream Apply pea size amount of cream with index finger to the vagina two times per week 42.5 g 0     No current facility-administered medications for this visit.     Allergies: No Known Allergies  Social History:   Social History     Socioeconomic History   ??? Marital status: Divorced     Spouse name: Not on file   ??? Number of children: Not on file   ??? Years of education: Not on file   ???  Highest education level: Not on file   Occupational History   ??? Not on file   Tobacco Use   ??? Smoking status: Never Smoker   ??? Smokeless tobacco: Never Used   Substance and Sexual Activity   ??? Alcohol use: Yes   ??? Drug use: Never   ??? Sexual activity: Yes     Partners: Male   Other Topics Concern   ??? Not on file   Social History Narrative   ??? Not on file     Social Determinants of Health     Financial Resource Strain:    ??? Difficulty of Paying Living Expenses: Not on file   Food Insecurity:    ??? Worried About Running Out of Food in the Last Year: Not on file   ??? Ran Out of Food in the Last Year: Not on file   Transportation Needs:    ??? Lack of Transportation (Medical): Not on file   ??? Lack of Transportation (Non-Medical): Not on file  Physical Activity:    ??? Days of Exercise per Week: Not on file   ??? Minutes of Exercise per Session: Not on file   Stress:    ??? Feeling of Stress : Not on file   Social Connections:    ??? Frequency of Communication with Friends and Family: Not on file   ??? Frequency of Social Gatherings with Friends and Family: Not on file   ??? Attends Religious Services: Not on file   ??? Active Member of Clubs or Organizations: Not on file   ??? Attends Banker Meetings: Not on file   ??? Marital Status: Not on file   Intimate Partner Violence:    ??? Fear of Current or Ex-Partner: Not on file   ??? Emotionally Abused: Not on file   ??? Physically Abused: Not on file   ??? Sexually Abused: Not on file   Housing Stability:    ??? Unable to Pay for Housing in the Last Year: Not on file   ??? Number of Places Lived in the Last Year: Not on file   ??? Unstable Housing in the Last Year: Not on file     Family History: History reviewed. No pertinent family history.  Review of System  Review of Systems    A review of systems was done by the patient and reviewed by me.    Objective:     Vital Signs  Vitals:    01/26/21 0838   BP: (!) 150/93   Pulse: 76   Resp: 16   Temp: 98 ??F (36.7 ??C)   SpO2: 99%      Physical  Exam  Physical Exam  HENT:      Head: Normocephalic and atraumatic.   Eyes:      Conjunctiva/sclera: Conjunctivae normal.   Pulmonary:      Effort: Pulmonary effort is normal.   Abdominal:      Palpations: Abdomen is soft.   Musculoskeletal:      Cervical back: Normal range of motion and neck supple.   Skin:     General: Skin is warm and dry.   Neurological:      Mental Status: She is alert and oriented to person, place, and time.         Procedure:   The urethral was anesthesized with topical lidocaine jelly and dilated to a #14 french. A 0-degree urethroscope was used to examine the urethra. A 70-degree cystoscope was used to evaluate the bladder.    FINDINGS:  1. Urethra was normal  2. Bladder had normal  3. Trigone appeared Normal  4. Ureters illustrated bilateral efflux  5. Patient exhibited spasms during the study no        No results found for this visit on 01/26/21.   Assessment/Plan:     Diane Woodward is a 60 y.o. female with   1. Recurrent UTI    2. Urgency of urination    3. Urinary frequency    4. Vaginal atrophy    5. Dysuria    Old records reviewed, outside records reviewed    [name] gives a history of bladder irritation.  She was originally seen in 2020 at which time we thought she may have recurrent urinary tract infections however has a history of needing multiple antibiotics in order to make it better.  She recently saw Drema Balzarine here in the office and was told to continue with her self start Macrobid as well as estrogen cream.  The symptoms however  does seem potentially more like painful bladder syndrome.  After long discussion today she has agreed to try and follow-up with [place] if she symptomatic both for culture and potentially for a bladder instillation.  Her and her husband are considering moving to [place] near the [place] area.    No orders of the defined types were placed in this encounter.    Orders Placed This Encounter   Medications   ??? nitrofurantoin, macrocrystal-monohydrate, (MACROBID)  100 MG capsule     Sig: Take 1 capsule by mouth 2 times daily     Dispense:  20 capsule     Refill:  1       Vena Rua, MD

## 2021-02-11 ENCOUNTER — Ambulatory Visit: Payer: PRIVATE HEALTH INSURANCE | Attending: Sports Medicine

## 2021-03-04 ENCOUNTER — Ambulatory Visit: Payer: PRIVATE HEALTH INSURANCE | Attending: Sports Medicine

## 2021-03-08 ENCOUNTER — Ambulatory Visit: Admit: 2021-03-08 | Discharge: 2021-03-08 | Payer: PRIVATE HEALTH INSURANCE | Attending: Sports Medicine

## 2021-03-08 DIAGNOSIS — G5762 Lesion of plantar nerve, left lower limb: Secondary | ICD-10-CM

## 2021-03-08 MED ORDER — betamethasone acetate-betamethasone sodium phosphate (CELESTONE) injection 6 mg
6 | INTRAMUSCULAR | Status: AC
Start: 2021-03-08 — End: 2021-03-08
  Administered 2021-03-08: 15:00:00 6 mg via INTRAMUSCULAR

## 2021-03-08 MED ORDER — lidocaine 10 mg/mL (1 %) injection 1 mL
10 | Freq: Once | INTRAMUSCULAR | Status: AC
Start: 2021-03-08 — End: 2021-03-08
  Administered 2021-03-08: 15:00:00 1 mL

## 2021-03-08 MED ORDER — bupivacaine HCl (MARCAINE) 0.5 % (5 mg/mL) injection 5 mg
0.5 | Freq: Once | INTRAMUSCULAR | Status: AC
Start: 2021-03-08 — End: 2021-03-08
  Administered 2021-03-08: 15:00:00 5 mL via SUBCUTANEOUS

## 2021-03-08 NOTE — Unmapped (Signed)
Chief Complaint   Patient presents with   ??? Establish Care     Left foot pain       HPI:      Christie Short is a 60 y.o. female who presents for follow-up evaluation of left foot pain.  The patient was last seen on 12/21/2020.    Since the last visit, Christie Short has noted burning and shooting pain between her left 2nd and 3rd toes. She ordered an insole and has tried metatarsal pad inserts. She reports no other new concerns at this time.    The notes and documentation from the physician extender were reviewed, verified, and edited if necessary.    Social History:  The patient is moving to [place] in September.    Medication  Current Outpatient Medications   Medication Sig Dispense Refill   ??? citalopram (CELEXA) 10 MG tablet Take by mouth.     ??? esomeprazole (NEXIUM) 20 MG capsule Take by mouth.     ??? estradiol 1.25 gram/actuation topical gel Place onto the skin.     ??? methylPREDNISolone (MEDROL, PAK,) 4 mg tablet follow package directions 21 Package 0   ??? naproxen (NAPROSYN) 500 MG tablet Take 1 tablet (500 mg total) by mouth 2 times a day with meals. 60 tablet 0   ??? nitrofurantoin (MACRODANTIN) 50 MG capsule Take by mouth.     ??? UNKNOWN TO PATIENT Anxiety medication. PRN     ??? zolpidem (AMBIEN) 10 mg tablet        No current facility-administered medications for this visit.       Allergies  No Known Allergies    Physical Examination:    Constitutional: This is a pleasant female, alert, and in no acute distress who appears stated age.    Vitals:    03/08/21 1017   Weight: 182 lb (82.6 kg)   Height: 5' 6 (1.676 m)       Psychiatric:   The patient's mood is normal and appropriate for their visit     Left foot exam:  TTP  between 2nd and 3rd metatarsal heads, positive squeeze test of metatarsal heads, neurovascularly intact.    Vascular exam:   Extremities warm and well perfused, no significant edema    Respiratory exam: Breathing easy and unlabored    Neuro exam:   No focal neuro deficits    Lymphatic:  No obvious  lymphadenopathy    Skin:     Warm, dry       Radiology:    No new imaging since last visit.    Assessment:    1. Morton's neuroma of left foot  lidocaine 10 mg/mL (1 %) injection 1 mL    betamethasone acetate-betamethasone sodium phosphate (CELESTONE) injection 6 mg    bupivacaine HCl (MARCAINE) 0.5 % (5 mg/mL) injection 5 mg       Plan:     Christie Short is a 60 y.o. female with left 2nd metatarsalgia. I additionally suspect Morton's neuroma. Overall, the patient has continued poorly controlled pain between her 2nd and 3rd toes. The patient opted for a Morton's Neuroma steroid injection today (see procedure note below). She will continue to use metatarsal pads/insoles with metatarsal pads built in. The patient voices understanding and all of her questions were answered.       [x]   Discussed return to activity considerations.  [x]   Discussed the use of pain medication and nonsteroidal anti-inflammatory agents including the risk of side effects which include  but not limited to:  the risk of increased blood pressure, bleeding, renal, gastrointestinal, and heart involvement.     [x]  Patient to consider ibuprofen or naprosyn for 1-2 weeks with food.     [x]  The patient can also consider acetaminophen for additional pain if needed.    []  Prescription given for pain medication or anti-inflammatory (see Epic orders)   [x]  May continue on their previously prescribed medication regimen   [x]   Ice for 15-20 min after activity and as needed  []   Consider glucosamine supplementation - 1500 mg/day for 2-3 months    []   Weight management   []   Brace/splint as needed for activity  [x]   Reviewed the role of a rehabilitative exercise program:   []  Patient elected for a referral or continued physical therapy     []  Patient elected for a home based program and instruction was given in the office today   []  Patient can progress to a home maintenance program  [x]   Reviewed the role of additional diagnostic and treatment options including  the role of imaging, injection therapy (cortisone and/or viscosupplementation), and possible surgical considerations.    Treatment plan per EPIC orders.    Follow-up as needed with any problems, questions, concerns, or worsening symptoms.    Neuroma Injection Procedure Note    Pre-operative Diagnosis: left Morton's Neuroma    Post-operative Diagnosis: normal    Indications: failure of conservative therapy    Anesthesia: 1 mL Lidocaine 1%    Procedure Details     Verbal consent was obtained for the procedure. After a sterile Betadine prep, the site was anesthetized.  A needle was advanced into the left forefoot between the second and third metatarsals without difficulty.  The space was then injected with 6 mg  betamethasone (CELESTONE) and 0.5% Marcaine. The area was cleansed with isopropyl alcohol and a dressing was applied.    Complications:  None; patient tolerated the procedure well.       This note was scribed by Milus Mallick  in the presence of Laverle Patter, MD, who reviewed this chart and validates that it is accurate and all medical decision making was done by them.     I, Laverle Patter, MD validate that this chart was completed under my supervision.     The scribe???s documentation has been prepared under my direction and personally reviewed by me in its entirety. I confirm that the note above accurately reflects all work, treatment, procedures, and medical decision making performed by me.

## 2021-03-08 NOTE — Unmapped (Signed)
Morton's neuroma injection     Per provider's instructions, the patient was prepped for an injection - as well as drug and  allergies were reviewed.   The appropriate medication was drawn, and documented.  A medical assistant was also present to assist with the injection.   The patient was then educated on what to expect following the injection.   All questions were answered.    A verbal consent was obtained and a verbal timeout was performed on 03/08/2021 at 10:47 AM.     ??  Wynette Jersey, MA

## 2021-05-31 MED ORDER — ESTROGENS, CONJUGATED 0.625 MG/GM VA CREA
0.625 MG/GM | VAGINAL | 0 refills | Status: AC
Start: 2021-05-31 — End: ?

## 2021-05-31 NOTE — Telephone Encounter (Signed)
Patient called and is requesting refill on estradiol gel.  Please send to [facility] on file.

## 2021-05-31 NOTE — Progress Notes (Signed)
Spoke with patient over the phone and informed her that premarin cream was sent to her preferred pharmacy. No other concerns at this time.

## 2021-06-04 MED ORDER — ESTRADIOL 0.1 MG/GM VA CREA
0.1 MG/GM | VAGINAL | 3 refills | Status: AC
Start: 2021-06-04 — End: ?

## 2022-01-17 MED ORDER — NITROFURANTOIN MONOHYD MACRO 100 MG PO CAPS
100 MG | ORAL_CAPSULE | Freq: Two times a day (BID) | ORAL | 1 refills | Status: AC
Start: 2022-01-17 — End: ?

## 2022-01-17 NOTE — Telephone Encounter (Signed)
Pt called and needs her macrobin refilled. She moved to [place]  so she needs it called into the [facility] on [address] in [place].  # [phone],

## 2022-01-17 NOTE — Telephone Encounter (Signed)
Per Elnita Maxwell, NP okay to send in refill - appt made for June when patient will be back in town. Will notify us if she's able to find a doctor to transfer care before then, if not will come in for yearly follow up. Patient notified and verbalized understanding.

## 2022-02-14 NOTE — Telephone Encounter (Signed)
Patient reports pressure and feeling urge, seems it has come back worse this time. She does have a culture from [place] but cannot figure out how to get it to [place]. Should she be seen? She will be in town for a 5 nights taking care of her mom. Please return her call. Thank you

## 2022-02-14 NOTE — Telephone Encounter (Signed)
Called patient back, states she has been having off and on symptoms of a UTI - lives in [place], but in town currently to take care of her mom. Appointment moved up to this week to have urine checked and discuss current prophy treatment.

## 2022-02-16 ENCOUNTER — Ambulatory Visit: Admit: 2022-02-16 | Discharge: 2022-02-16 | Payer: PRIVATE HEALTH INSURANCE | Attending: Nurse Practitioner

## 2022-02-16 DIAGNOSIS — R350 Frequency of micturition: Secondary | ICD-10-CM

## 2022-02-16 LAB — POCT URINALYSIS DIPSTICK
Bilirubin, UA: NEGATIVE
Blood, UA POC: NEGATIVE
Glucose, UA POC: NEGATIVE
Ketones, UA: NEGATIVE
Leukocytes, UA: NEGATIVE
Nitrite, UA: NEGATIVE
Protein, UA POC: NEGATIVE
Spec Grav, UA: 1.015
Urobilinogen, UA: NEGATIVE
pH, UA: 6.5

## 2022-02-16 NOTE — Progress Notes (Signed)
02/16/2022       HPI:     Name: Encarnacion ChuKathy Hennigan  Date of Birth: [birth year]    CC: Patient is a 61 y.o. presenting for evaluation of  recurrent UTI's .   HPI: How long have you had this problem?  years  Please rate the severity of your problem: moderate  Anything make it better?     Has been using Self Start Macrobid and Estrogen Cream, Cranberry tabs. Last seen in office on 01/26/21 - moved to [place], but in town visiting family. Had been doing well, only used self start twice in last year but recently noticing increased pressure and urge.    Had culture obtained in [place] recently - brought results. 01/18/22 E coli >100,000 units. Treated with Macrobid.   She feels the treatment did not resolve the UTI and present today for culture  She is in town this week caring for her mother.      Ob/Gyn History:    OB History   No obstetric history on file.     Past Medical History: History reviewed. No pertinent past medical history.  Past Surgical History:   Past Surgical History:   Procedure Laterality Date    APPENDECTOMY  2014    BREAST REDUCTION SURGERY  07/2018    CHOLECYSTECTOMY  2012    HYSTERECTOMY, TOTAL ABDOMINAL (CERVIX REMOVED)      has had 3 seperate surgerys     INCONTINENCE SURGERY  07/2005     Allergies: No Known Allergies  Current Medications:  Current Outpatient Medications   Medication Sig Dispense Refill    estradiol (ESTRACE VAGINAL) 0.1 MG/GM vaginal cream Apply a pea-sized amount (0.5gram) to the vaginal opening every night at bedtime for 2 weeks, then decrease to two times weekly thereafter 30 g 3    acyclovir (ZOVIRAX) 400 MG tablet Take 1 tablet by mouth 3 times daily      amLODIPine (NORVASC) 2.5 MG tablet Take 1 tablet by mouth daily      citalopram (CELEXA) 10 MG tablet Take by mouth      esomeprazole (NEXIUM) 20 MG delayed release capsule Take by mouth      zolpidem (AMBIEN) 10 MG tablet       Sulfamethoxazole-Trimethoprim (BACTRIM PO) Take by mouth (Patient not taking: Reported on 02/16/2022)       nitrofurantoin, macrocrystal-monohydrate, (MACROBID) 100 MG capsule Take 1 capsule by mouth 2 times daily (Patient not taking: Reported on 02/16/2022) 20 capsule 1    conjugated estrogens (PREMARIN) 0.625 MG/GM vaginal cream Apply pea size amount of cream with index finger to the vagina two times per week (Patient not taking: Reported on 02/16/2022) 42.5 g 0    Estradiol 0.75 MG/1.25 GM (0.06%) GEL Place onto the skin  (Patient not taking: Reported on 02/16/2022)      fexofenadine (ALLEGRA) 180 MG tablet Take 180 mg by mouth daily  (Patient not taking: Reported on 02/16/2022)      fluticasone (FLONASE) 50 MCG/ACT nasal spray 2 sprays by Nasal route daily  (Patient not taking: Reported on 02/16/2022)      olopatadine (PATANASE) 0.6 % SOLN nassl soln 1 spray by Nasal route daily  (Patient not taking: Reported on 02/16/2022)       No current facility-administered medications for this visit.     Social History:   Social History     Socioeconomic History    Marital status: Divorced     Spouse name: Not on file  Number of children: Not on file    Years of education: Not on file    Highest education level: Not on file   Occupational History    Not on file   Tobacco Use    Smoking status: Never    Smokeless tobacco: Never   Substance and Sexual Activity    Alcohol use: Yes    Drug use: Never    Sexual activity: Yes     Partners: Male   Other Topics Concern    Not on file   Social History Narrative    Not on file     Social Determinants of Health     Financial Resource Strain: Not on file   Food Insecurity: Not on file   Transportation Needs: Not on file   Physical Activity: Not on file   Stress: Not on file   Social Connections: Not on file   Intimate Partner Violence: Not on file   Housing Stability: Not on file     Family History: No family history on file.      Objective:     Vitals  Vitals:    02/16/22 1011   BP: (!) 148/92   Pulse: 88   Resp: 16   Temp: 98.6 F (37 C)   SpO2: 99%     Physical Exam  Physical Exam  Vitals  and nursing note reviewed.   Constitutional:       Appearance: Normal appearance.   HENT:      Head: Normocephalic.   Eyes:      Conjunctiva/sclera: Conjunctivae normal.   Cardiovascular:      Rate and Rhythm: Normal rate.   Pulmonary:      Effort: Pulmonary effort is normal.   Musculoskeletal:         General: Normal range of motion.      Cervical back: Normal range of motion.   Skin:     General: Skin is warm and dry.   Neurological:      General: No focal deficit present.      Mental Status: She is alert.   Psychiatric:         Mood and Affect: Mood normal.         Behavior: Behavior normal.     Straight urinary catheterization performed by sterile procedure for urine specimen per Drema Balzarine, NP.      Results for POC orders placed in visit on 02/16/22   POCT Urinalysis no Micro   Result Value Ref Range    Color, UA yellow     Clarity, UA clear     Glucose, UA POC neg     Bilirubin, UA neg     Ketones, UA neg     Spec Grav, UA 1.015     Blood, UA POC neg     pH, UA 6.5     Protein, UA POC neg     Urobilinogen, UA neg     Leukocytes, UA neg     Nitrite, UA neg        Assessment/Plan:     Norissa Bartee is a 61 y.o. female with   1. Urinary frequency    2. Recurrent UTI    3. Urgency of urination    4. Dysuria    5. Vaginal atrophy      Hx of UTI: Recent positive culture reviewed on patients phone: E.coli: Pan Sensitive.  Treated with macrobid.  She is in town caring for her mother and has  been very symptomatic with bladder pressure and discomfort.  We had a long discussion regarding UTI vs IC as she did have memory of speaking with Dr. Nicolasa Ducking about this last year.  She is trying to get set up with new physicians in [place] where she now lives.  We discussed bladder diet for IC and treatment with instillations which would be weekly and obviously she would not be able to follow with [place] for these.  If her culture is negative however, she would like to return on Friday for instillation just to see if it gives her any  relief even short term.    Await results.  Carlena Bjornstad, APRN - CNP        Orders Placed This Encounter   Procedures    Culture, Urine     Order Specific Question:   Specify (ex-cath, midstream, cysto, etc)?     Answer:   straight cath    POCT Urinalysis no Micro    PR INSJ NON-NDWELLG BLADDER CATHETER     No orders of the defined types were placed in this encounter.      Carlena Bjornstad, APRN - CNP

## 2022-02-18 ENCOUNTER — Ambulatory Visit: Admit: 2022-02-18 | Discharge: 2022-02-18 | Payer: PRIVATE HEALTH INSURANCE | Attending: Nurse Practitioner

## 2022-02-18 DIAGNOSIS — R350 Frequency of micturition: Secondary | ICD-10-CM

## 2022-02-18 LAB — POCT URINALYSIS DIPSTICK
Bilirubin, UA: NEGATIVE
Blood, UA POC: NEGATIVE
Glucose, UA POC: NEGATIVE
Ketones, UA: NEGATIVE
Leukocytes, UA: NEGATIVE
Nitrite, UA: NEGATIVE
Protein, UA POC: NEGATIVE
Spec Grav, UA: 1.015
Urobilinogen, UA: NEGATIVE
pH, UA: 5

## 2022-02-18 MED ORDER — SODIUM BICARBONATE 8.4 % IV SOLN
8.4 % | Freq: Once | INTRAVENOUS | Status: AC
Start: 2022-02-18 — End: 2022-02-18
  Administered 2022-02-18: 19:00:00 50 meq via INTRAVESICAL

## 2022-02-18 MED ORDER — LIDOCAINE HCL 2 % IJ SOLN
2 % | Freq: Once | INTRAMUSCULAR | Status: AC
Start: 2022-02-18 — End: 2022-02-18
  Administered 2022-02-18: 19:00:00 20 mL

## 2022-02-18 MED ORDER — METHYLPREDNISOLONE SODIUM SUCC 40 MG IJ SOLR
40 MG | Freq: Once | INTRAMUSCULAR | Status: AC
Start: 2022-02-18 — End: 2022-02-18
  Administered 2022-02-18: 19:00:00 40 mg

## 2022-02-18 MED ORDER — HEPARIN SODIUM (PORCINE) 10000 UNIT/ML IJ SOLN
10000 UNIT/ML | Freq: Once | INTRAMUSCULAR | Status: AC
Start: 2022-02-18 — End: 2022-02-18
  Administered 2022-02-18: 19:00:00 10000 [IU] via INTRAVESICAL

## 2022-02-18 NOTE — Progress Notes (Signed)
02/21/2022     HPI:     Name: Diane Woodward  Date of Birth: 08-11-1961    CC: Diane Woodward is a 61 y.o. female who is here for follow up of bladder pain.  HPI: How long have you had this problem?    Please rate the severity of your problem:  mild  Anything make it better?     Component 02/16/22 1031   Urine Culture, Routine No growth to date P    I phoned patient today to advise of negative urine culture and requested she come in for instillation.    She reports she had larosas pizza sauce last night and immediately had pain and frequency  She returns [facility] tomorrow      Ob/Gyn History:    OB History   No obstetric history on file.     Past Medical History: History reviewed. No pertinent past medical history.  Past Surgical History:   Past Surgical History:   Procedure Laterality Date    APPENDECTOMY  2014    BREAST REDUCTION SURGERY  07/2018    CHOLECYSTECTOMY  2012    HYSTERECTOMY, TOTAL ABDOMINAL (CERVIX REMOVED)      has had 3 seperate surgerys     INCONTINENCE SURGERY  07/2005     Current Medications:  Current Outpatient Medications   Medication Sig Dispense Refill    estradiol (ESTRACE VAGINAL) 0.1 MG/GM vaginal cream Apply a pea-sized amount (0.5gram) to the vaginal opening every night at bedtime for 2 weeks, then decrease to two times weekly thereafter 30 g 3    acyclovir (ZOVIRAX) 400 MG tablet Take 1 tablet by mouth 3 times daily      amLODIPine (NORVASC) 2.5 MG tablet Take 1 tablet by mouth daily      citalopram (CELEXA) 10 MG tablet Take by mouth      esomeprazole (NEXIUM) 20 MG delayed release capsule Take by mouth      Sulfamethoxazole-Trimethoprim (BACTRIM PO) Take by mouth (Patient not taking: Reported on 02/16/2022)      nitrofurantoin, macrocrystal-monohydrate, (MACROBID) 100 MG capsule Take 1 capsule by mouth 2 times daily (Patient not taking: Reported on 02/16/2022) 20 capsule 1    conjugated estrogens (PREMARIN) 0.625 MG/GM vaginal cream Apply pea size amount of cream with index finger to the vagina  two times per week (Patient not taking: Reported on 02/16/2022) 42.5 g 0    Estradiol 0.75 MG/1.25 GM (0.06%) GEL Place onto the skin  (Patient not taking: Reported on 02/16/2022)      fexofenadine (ALLEGRA) 180 MG tablet Take 180 mg by mouth daily  (Patient not taking: Reported on 02/16/2022)      fluticasone (FLONASE) 50 MCG/ACT nasal spray 2 sprays by Nasal route daily  (Patient not taking: Reported on 02/16/2022)      olopatadine (PATANASE) 0.6 % SOLN nassl soln 1 spray by Nasal route daily  (Patient not taking: Reported on 02/16/2022)      zolpidem (AMBIEN) 10 MG tablet  (Patient not taking: Reported on 02/18/2022)       No current facility-administered medications for this visit.     Allergies: No Known Allergies  Social History:   Social History     Socioeconomic History    Marital status: Divorced     Spouse name: Not on file    Number of children: Not on file    Years of education: Not on file    Highest education level: Not on file   Occupational History  Not on file   Tobacco Use    Smoking status: Never     Passive exposure: Past    Smokeless tobacco: Never   Substance and Sexual Activity    Alcohol use: Yes    Drug use: Never    Sexual activity: Yes     Partners: Male   Other Topics Concern    Not on file   Social History Narrative    Not on file     Social Determinants of Health     Financial Resource Strain: Not on file   Food Insecurity: Not on file   Transportation Needs: Not on file   Physical Activity: Not on file   Stress: Not on file   Social Connections: Not on file   Intimate Partner Violence: Not on file   Housing Stability: Not on file     Family History: History reviewed. No pertinent family history.      Objective:     Vitals  Vitals:    02/18/22 1424   BP: 129/86   Pulse: 85   Resp: 16   Temp: 98.1 F (36.7 C)   SpO2: 96%     Physical Exam  Physical Exam  Vitals and nursing note reviewed.   Constitutional:       Appearance: Normal appearance.   HENT:      Head: Normocephalic.   Eyes:       Conjunctiva/sclera: Conjunctivae normal.   Cardiovascular:      Rate and Rhythm: Normal rate.   Pulmonary:      Effort: Pulmonary effort is normal.   Musculoskeletal:         General: Normal range of motion.      Cervical back: Normal range of motion.   Skin:     General: Skin is warm and dry.   Neurological:      General: No focal deficit present.      Mental Status: She is alert.   Psychiatric:         Mood and Affect: Mood normal.         Behavior: Behavior normal.     Patient counseled and consented on bladder instillation.  Urethra was cleaned in sterile fashion.  The urethra was then anesthetized using lidocaine gel.  Using a sterile catheter, the bladder was first drained then instilled with a medication solution containing heparin sodium 10,000 u, Solumedrol , 8.4% Sodium Bicarbonate 5 ml, and Lidocaine 1 % 20 ml.  Once the entire volume of the solution was instilled, the catheter was removed.  The patient was asked to retain the instillation in their bladder for a minimum of 1 hour and should not exceed 3 hours.  Patient tolerated the procedure well.  She is to call the office with any concerns.    Results for POC orders placed in visit on 02/18/22   POCT Urinalysis no Micro   Result Value Ref Range    Color, UA yellow     Clarity, UA clear     Glucose, UA POC neg     Bilirubin, UA neg     Ketones, UA neg     Spec Grav, UA 1.015     Blood, UA POC neg     pH, UA 5.0     Protein, UA POC neg     Urobilinogen, UA neg     Leukocytes, UA neg     Nitrite, UA neg        Assessment/Plan:  Diane Woodward is a 61 y.o. female with   1. Urinary frequency    2. Urgency of urination    3. Dysuria    4. Bladder pain      -Bladder pain:  her urine culture was negative from 5/17.  She reports eating red sauce last evening and had instant bladder discomfort and frequency.  WE elected a trial of an instillation today even though she will not be able to return. (She lives in [place] and returns tomorrow)  She will find  Warehouse managerUrogynecologist in [place].    Bladder diet information given.  F/U prn  Carlena Bjornstadheryl Ann Aleighya Mcanelly, APRN - CNP    Orders Placed This Encounter   Procedures    POCT Urinalysis no Micro    PR INSJ NON-NDWELLG BLADDER CATHETER     Orders Placed This Encounter   Medications    lidocaine 2 % injection 20 mL    heparin (porcine) injection 10,000 Units    sodium bicarbonate 8.4 % injection 50 mEq    methylPREDNISolone sodium succ (SOLU-MEDROL) injection 40 mg       Carlena Bjornstadheryl Ann Kei Mcelhiney, APRN - CNP

## 2022-02-18 NOTE — Telephone Encounter (Signed)
Left vm for patient to give [place] a call back we would like for her to come in for instillation today before she leaves on a trip.

## 2022-02-19 LAB — CULTURE, URINE: Urine Culture, Routine: NO GROWTH

## 2022-03-04 NOTE — Telephone Encounter (Signed)
Patient has moved and requested her urogyn medical records be faxed to Dr Loney Laurence, MD / [place] @ [phone]. Fax success and scanned in media

## 2022-03-28 ENCOUNTER — Encounter: Payer: PRIVATE HEALTH INSURANCE | Attending: Nurse Practitioner

## 2022-05-02 ENCOUNTER — Ambulatory Visit: Admit: 2022-05-02 | Discharge: 2022-05-02 | Payer: PRIVATE HEALTH INSURANCE | Attending: Nurse Practitioner

## 2022-05-02 DIAGNOSIS — N390 Urinary tract infection, site not specified: Secondary | ICD-10-CM

## 2022-05-02 LAB — POCT URINALYSIS DIPSTICK
Bilirubin, UA: NEGATIVE
Blood, UA POC: NEGATIVE
Glucose, UA POC: NEGATIVE
Ketones, UA: NEGATIVE
Leukocytes, UA: POSITIVE
Nitrite, UA: POSITIVE
Protein, UA POC: NEGATIVE
Spec Grav, UA: 1.015
Urobilinogen, UA: NEGATIVE
pH, UA: 6.5

## 2022-05-02 MED ORDER — LIDOCAINE HCL 2 % IJ SOLN
2 % | Freq: Once | INTRAMUSCULAR | Status: DC
Start: 2022-05-02 — End: 2022-05-02

## 2022-05-02 MED ORDER — HYDROCORTISONE SOD SUC (PF) 100 MG IJ SOLR
100 MG | Freq: Once | INTRAMUSCULAR | Status: DC
Start: 2022-05-02 — End: 2022-05-02

## 2022-05-02 MED ORDER — SODIUM BICARBONATE 8.4 % IV SOLN
8.4 % | Freq: Once | INTRAVENOUS | Status: DC
Start: 2022-05-02 — End: 2022-05-02

## 2022-05-02 MED ORDER — CIPROFLOXACIN HCL 500 MG PO TABS
500 MG | ORAL_TABLET | Freq: Two times a day (BID) | ORAL | 0 refills | Status: AC
Start: 2022-05-02 — End: 2022-05-07

## 2022-05-02 MED ORDER — HEPARIN SODIUM (PORCINE) 10000 UNIT/ML IJ SOLN
10000 UNIT/ML | Freq: Once | INTRAMUSCULAR | Status: DC
Start: 2022-05-02 — End: 2022-05-02

## 2022-05-02 NOTE — Progress Notes (Signed)
05/02/2022     HPI:     Name: Diane Woodward  Date of Birth: Mar 20, 1961    CC: Diane Woodward is a 61 y.o. female who is here for follow up of bladder pain.  HPI: How long have you had this problem?  years  Please rate the severity of your problem:  moderate  Anything make it better? Patient lives in [place], in town and requesting instillation. Having a flare currently.    Seeing Garlon Hatchet, MD in [facility] who does treat IC.  [name] will see her in the future when she has a flare    Today she is in town and believes she is having a flare  Having urine with strong odor, headache, general malaise, urinary pressure    Ob/Gyn History:    OB History   No obstetric history on file.     Past Medical History: History reviewed. No pertinent past medical history.  Past Surgical History:   Past Surgical History:   Procedure Laterality Date    APPENDECTOMY  2014    BREAST REDUCTION SURGERY  07/2018    CHOLECYSTECTOMY  2012    HYSTERECTOMY, TOTAL ABDOMINAL (CERVIX REMOVED)      has had 3 seperate surgerys     INCONTINENCE SURGERY  07/2005     Current Medications:  Current Outpatient Medications   Medication Sig Dispense Refill    ciprofloxacin (CIPRO) 500 MG tablet Take 1 tablet by mouth 2 times daily for 5 days 10 tablet 0    ELMIRON 100 MG capsule       citalopram (CELEXA) 20 MG tablet       esomeprazole (NEXIUM) 40 MG delayed release capsule TAKE ONE CAPSULE BY MOUTH ONE TIME DAILY      Sulfamethoxazole-Trimethoprim (BACTRIM PO) Take by mouth (Patient not taking: Reported on 02/16/2022)      nitrofurantoin, macrocrystal-monohydrate, (MACROBID) 100 MG capsule Take 1 capsule by mouth 2 times daily (Patient not taking: Reported on 02/16/2022) 20 capsule 1    estradiol (ESTRACE VAGINAL) 0.1 MG/GM vaginal cream Apply a pea-sized amount (0.5gram) to the vaginal opening every night at bedtime for 2 weeks, then decrease to two times weekly thereafter 30 g 3    conjugated estrogens (PREMARIN) 0.625 MG/GM vaginal cream Apply pea size  amount of cream with index finger to the vagina two times per week (Patient not taking: Reported on 02/16/2022) 42.5 g 0    acyclovir (ZOVIRAX) 400 MG tablet Take 1 tablet by mouth 3 times daily      amLODIPine (NORVASC) 2.5 MG tablet Take 1 tablet by mouth daily      Estradiol 0.75 MG/1.25 GM (0.06%) GEL Place onto the skin  (Patient not taking: Reported on 02/16/2022)      fexofenadine (ALLEGRA) 180 MG tablet Take 180 mg by mouth daily  (Patient not taking: Reported on 02/16/2022)      fluticasone (FLONASE) 50 MCG/ACT nasal spray 2 sprays by Nasal route daily  (Patient not taking: Reported on 02/16/2022)      olopatadine (PATANASE) 0.6 % SOLN nassl soln 1 spray by Nasal route daily  (Patient not taking: Reported on 02/16/2022)      zolpidem (AMBIEN) 10 MG tablet  (Patient not taking: Reported on 02/18/2022)       Current Facility-Administered Medications   Medication Dose Route Frequency Provider Last Rate Last Admin    heparin (porcine) injection 10,000 Units  10,000 Units Bladder Once Carlena Bjornstad, APRN - CNP  hydrocortisone sodium succinate PF (SOLU-CORTEF) injection 100 mg  100 mg IntraMUSCular Once Carlena Bjornstad, APRN - CNP        sodium bicarbonate 8.4 % injection 5 mEq  5 mEq Bladder Instillation Once Carlena Bjornstad, APRN - CNP        lidocaine 2 % injection 20 mL  20 mL Other Once Carlena Bjornstad, APRN - CNP         Allergies: No Known Allergies  Social History:   Social History     Socioeconomic History    Marital status: Divorced     Spouse name: Not on file    Number of children: Not on file    Years of education: Not on file    Highest education level: Not on file   Occupational History    Not on file   Tobacco Use    Smoking status: Never     Passive exposure: Past    Smokeless tobacco: Never   Substance and Sexual Activity    Alcohol use: Yes    Drug use: Never    Sexual activity: Yes     Partners: Male   Other Topics Concern    Not on file   Social History Narrative    Not on file      Social Determinants of Health     Financial Resource Strain: Not on file   Food Insecurity: Not on file   Transportation Needs: Not on file   Physical Activity: Not on file   Stress: Not on file   Social Connections: Not on file   Intimate Partner Violence: Not on file   Housing Stability: Not on file     Family History: History reviewed. No pertinent family history.      Objective:     Vitals  Vitals:    05/02/22 0833 05/02/22 0836   BP: (!) 168/102 (!) 166/95   Pulse: 87    Resp: 16    Temp: 97.5 F (36.4 C)    SpO2: 99%      Physical Exam  Physical Exam  Vitals and nursing note reviewed.   Constitutional:       Appearance: Normal appearance.   HENT:      Head: Normocephalic.   Eyes:      Conjunctiva/sclera: Conjunctivae normal.   Cardiovascular:      Rate and Rhythm: Normal rate.   Pulmonary:      Effort: Pulmonary effort is normal.   Musculoskeletal:         General: Normal range of motion.      Cervical back: Normal range of motion.   Skin:     General: Skin is warm and dry.   Neurological:      General: No focal deficit present.      Mental Status: She is alert.   Psychiatric:         Mood and Affect: Mood normal.         Behavior: Behavior normal.         Results for POC orders placed in visit on 05/02/22   POCT Urinalysis no Micro   Result Value Ref Range    Color, UA yellow     Clarity, UA cloudy     Glucose, UA POC neg     Bilirubin, UA neg     Ketones, UA neg     Spec Grav, UA 1.015     Blood, UA POC neg     pH,  UA 6.5     Protein, UA POC neg     Urobilinogen, UA neg     Leukocytes, UA positive, large     Nitrite, UA positive, small        Assessment/Plan:     [name] is a 61 y.o. female with   1. Acute UTI    2. Urinary frequency    3. Urgency of urination    -Acute UTI:  urine dip indicates UTI:    Will send for culture  Begin Cipro 500 mg BID for 5 days  -Is to start Instillations with her MD in [place] in 2 weeks    F/U prn  Carlena Bjornstad, APRN - CNP      Orders Placed This Encounter    Procedures    Culture, Urine     Order Specific Question:   Specify (ex-cath, midstream, cysto, etc)?     Answer:   straight cath    POCT Urinalysis no Micro    PR INSJ NON-NDWELLG BLADDER CATHETER     Orders Placed This Encounter   Medications    heparin (porcine) injection 10,000 Units    hydrocortisone sodium succinate PF (SOLU-CORTEF) injection 100 mg    sodium bicarbonate 8.4 % injection 5 mEq    lidocaine 2 % injection 20 mL    ciprofloxacin (CIPRO) 500 MG tablet     Sig: Take 1 tablet by mouth 2 times daily for 5 days     Dispense:  10 tablet     Refill:  0       Carlena Bjornstad, APRN - CNP

## 2022-05-02 NOTE — Progress Notes (Incomplete Revision)
05/02/2022     HPI:     Name: Diane Woodward  Date of Birth: 14-Mar-1961    CC: Diane Woodward is a 61 y.o. female who is here for follow up of bladder pain.  HPI: How long have you had this problem?  years  Please rate the severity of your problem:  moderate  Anything make it better? Patient lives in [place], in town and requesting instillation. Having a flare currently.    Seeing Garlon Hatchet, MD in [facility] who does treat IC.  [name] will see her in the future when she has a flare    Today she is in town and believes she is having a flare  Having urine with strong odor, headache, general malaise, urinary pressure    Ob/Gyn History:    OB History   No obstetric history on file.     Past Medical History: History reviewed. No pertinent past medical history.  Past Surgical History:   Past Surgical History:   Procedure Laterality Date   . APPENDECTOMY  2014   . BREAST REDUCTION SURGERY  07/2018   . CHOLECYSTECTOMY  2012   . HYSTERECTOMY, TOTAL ABDOMINAL (CERVIX REMOVED)      has had 3 seperate surgerys    . INCONTINENCE SURGERY  07/2005     Current Medications:  Current Outpatient Medications   Medication Sig Dispense Refill   . ciprofloxacin (CIPRO) 500 MG tablet Take 1 tablet by mouth 2 times daily for 5 days 10 tablet 0   . ELMIRON 100 MG capsule      . citalopram (CELEXA) 20 MG tablet      . esomeprazole (NEXIUM) 40 MG delayed release capsule TAKE ONE CAPSULE BY MOUTH ONE TIME DAILY     . Sulfamethoxazole-Trimethoprim (BACTRIM PO) Take by mouth (Patient not taking: Reported on 02/16/2022)     . nitrofurantoin, macrocrystal-monohydrate, (MACROBID) 100 MG capsule Take 1 capsule by mouth 2 times daily (Patient not taking: Reported on 02/16/2022) 20 capsule 1   . estradiol (ESTRACE VAGINAL) 0.1 MG/GM vaginal cream Apply a pea-sized amount (0.5gram) to the vaginal opening every night at bedtime for 2 weeks, then decrease to two times weekly thereafter 30 g 3   . conjugated estrogens (PREMARIN) 0.625 MG/GM vaginal cream  Apply pea size amount of cream with index finger to the vagina two times per week (Patient not taking: Reported on 02/16/2022) 42.5 g 0   . acyclovir (ZOVIRAX) 400 MG tablet Take 1 tablet by mouth 3 times daily     . amLODIPine (NORVASC) 2.5 MG tablet Take 1 tablet by mouth daily     . Estradiol 0.75 MG/1.25 GM (0.06%) GEL Place onto the skin  (Patient not taking: Reported on 02/16/2022)     . fexofenadine (ALLEGRA) 180 MG tablet Take 180 mg by mouth daily  (Patient not taking: Reported on 02/16/2022)     . fluticasone (FLONASE) 50 MCG/ACT nasal spray 2 sprays by Nasal route daily  (Patient not taking: Reported on 02/16/2022)     . olopatadine (PATANASE) 0.6 % SOLN nassl soln 1 spray by Nasal route daily  (Patient not taking: Reported on 02/16/2022)     . zolpidem (AMBIEN) 10 MG tablet  (Patient not taking: Reported on 02/18/2022)       Current Facility-Administered Medications   Medication Dose Route Frequency Provider Last Rate Last Admin   . heparin (porcine) injection 10,000 Units  10,000 Units Bladder Once Carlena Bjornstad, APRN - CNP       .  hydrocortisone sodium succinate PF (SOLU-CORTEF) injection 100 mg  100 mg IntraMUSCular Once Carlena Bjornstad, APRN - CNP       . sodium bicarbonate 8.4 % injection 5 mEq  5 mEq Bladder Instillation Once Carlena Bjornstad, APRN - CNP       . lidocaine 2 % injection 20 mL  20 mL Other Once Carlena Bjornstad, APRN - CNP         Allergies: No Known Allergies  Social History:   Social History     Socioeconomic History   . Marital status: Divorced     Spouse name: Not on file   . Number of children: Not on file   . Years of education: Not on file   . Highest education level: Not on file   Occupational History   . Not on file   Tobacco Use   . Smoking status: Never     Passive exposure: Past   . Smokeless tobacco: Never   Substance and Sexual Activity   . Alcohol use: Yes   . Drug use: Never   . Sexual activity: Yes     Partners: Male   Other Topics Concern   . Not on file   Social  History Narrative   . Not on file     Social Determinants of Health     Financial Resource Strain: Not on file   Food Insecurity: Not on file   Transportation Needs: Not on file   Physical Activity: Not on file   Stress: Not on file   Social Connections: Not on file   Intimate Partner Violence: Not on file   Housing Stability: Not on file     Family History: History reviewed. No pertinent family history.      Objective:     Vitals  Vitals:    05/02/22 0833 05/02/22 0836   BP: (!) 168/102 (!) 166/95   Pulse: 87    Resp: 16    Temp: 97.5 F (36.4 C)    SpO2: 99%      Physical Exam  Physical Exam  Vitals and nursing note reviewed.   Constitutional:       Appearance: Normal appearance.   HENT:      Head: Normocephalic.   Eyes:      Conjunctiva/sclera: Conjunctivae normal.   Cardiovascular:      Rate and Rhythm: Normal rate.   Pulmonary:      Effort: Pulmonary effort is normal.   Musculoskeletal:         General: Normal range of motion.      Cervical back: Normal range of motion.   Skin:     General: Skin is warm and dry.   Neurological:      General: No focal deficit present.      Mental Status: She is alert.   Psychiatric:         Mood and Affect: Mood normal.         Behavior: Behavior normal.       Straight urinary catheterization performed by sterile procedure for urine specimen per Drema Balzarine, NP.      Results for POC orders placed in visit on 05/02/22   POCT Urinalysis no Micro   Result Value Ref Range    Color, UA yellow     Clarity, UA cloudy     Glucose, UA POC neg     Bilirubin, UA neg     Ketones, UA neg  Spec Grav, UA 1.015     Blood, UA POC neg     pH, UA 6.5     Protein, UA POC neg     Urobilinogen, UA neg     Leukocytes, UA positive, large     Nitrite, UA positive, small        Assessment/Plan:     [name] is a 61 y.o. female with   1. Acute UTI    2. Urinary frequency    3. Urgency of urination    -Acute UTI:  urine dip indicates UTI:    Will send for culture  Begin Cipro 500 mg BID for 5  days  -Is to start Instillations with her MD in [place] in 2 weeks    F/U prn  Carlena Bjornstad, APRN - CNP      Orders Placed This Encounter   Procedures   . Culture, Urine     Order Specific Question:   Specify (ex-cath, midstream, cysto, etc)?     Answer:   straight cath   . POCT Urinalysis no Micro   . PR INSJ NON-NDWELLG BLADDER CATHETER     Orders Placed This Encounter   Medications   . heparin (porcine) injection 10,000 Units   . hydrocortisone sodium succinate PF (SOLU-CORTEF) injection 100 mg   . sodium bicarbonate 8.4 % injection 5 mEq   . lidocaine 2 % injection 20 mL   . ciprofloxacin (CIPRO) 500 MG tablet     Sig: Take 1 tablet by mouth 2 times daily for 5 days     Dispense:  10 tablet     Refill:  0       Carlena Bjornstad, APRN - CNP

## 2022-05-05 LAB — CULTURE, URINE: Urine Culture, Routine: >100000

## 2022-05-05 NOTE — Other (Signed)
Notified patient of positive culture and need to complete her treatment, she verbalized understanding.

## 2022-07-30 IMAGING — MR MRI BRAIN WITHOUT CONTRAST
7 of 9 series · 27 of 48 positions shown · non-contrast
Comparison: None

________________________________________________________________________________________________ 
MRI BRAIN WITHOUT CONTRAST, 07/30/2022 [DATE]: 
CLINICAL INDICATION: Dizziness
TECHNIQUE: Axial T1, Axial T2, Axial FLAIR, Diffusion weighted images, Sagittal 
T1, and Coronal T2 MR images of the brain were performed without intravenous 
contrast enhancement.

[Series 102: mpr - smartbrain · axial · 1.1mm · 1.09mm/px · 1 of 2 slices shown]
[im 1/2]
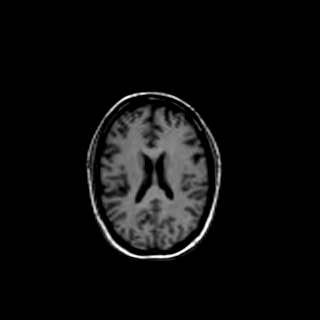

[Series 103: patient aligned mpr · axial · 21.9mm · 1.09mm/px · z∈[-214,+183]mm · 6 of 53 slices shown]
[im 1/53]
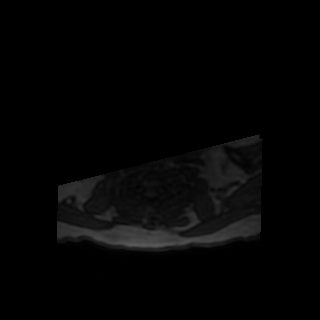
[im 11/53]
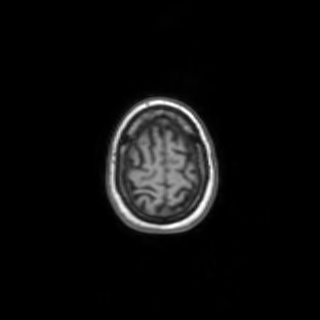
[im 21/53]
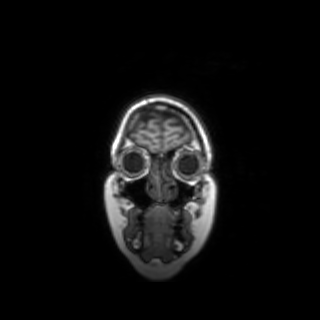
[im 32/53]
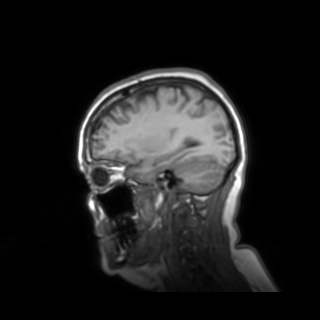
[im 42/53]
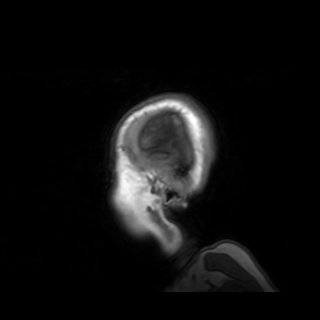
[im 53/53]
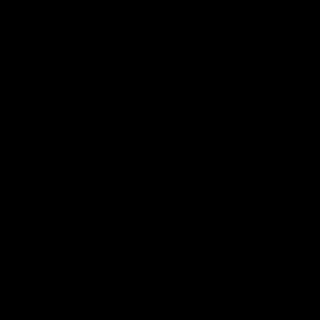

[Series 203: dadc map · axial · 5.0mm · 1.03mm/px · z∈[-108,+47]mm · 3 of 27 slices shown]
[im 1/27]
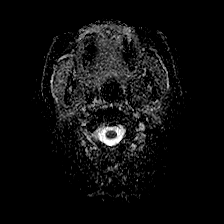
[im 14/27]
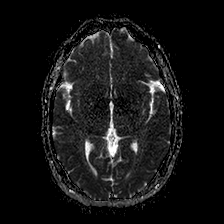
[im 27/27]
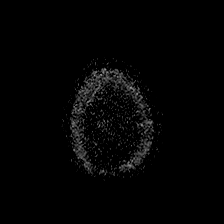

[Series 204: (id) · axial · 5.0mm · 1.03mm/px · z∈[-108,+47]mm · 3 of 27 slices shown]
[im 1/27]
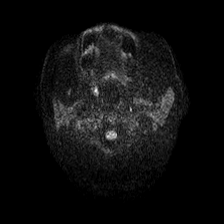
[im 14/27]
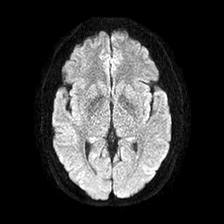
[im 27/27]
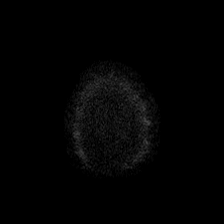

[Series 301: t1_se_sag · sagittal · 4.0mm · 0.43mm/px · 4 of 28 slices shown]
[im 1/28]
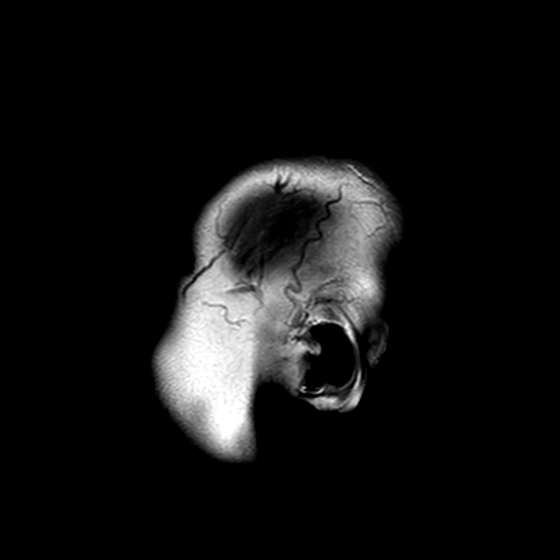
[im 10/28]
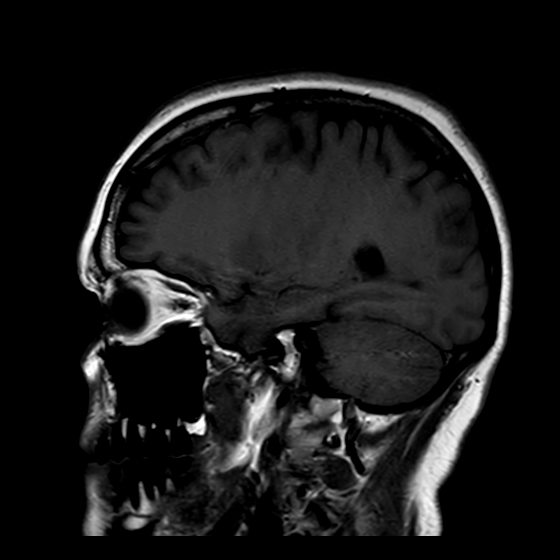
[im 19/28]
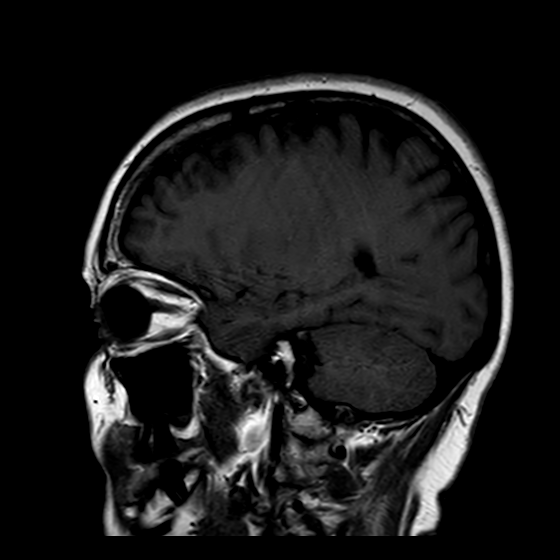
[im 28/28]
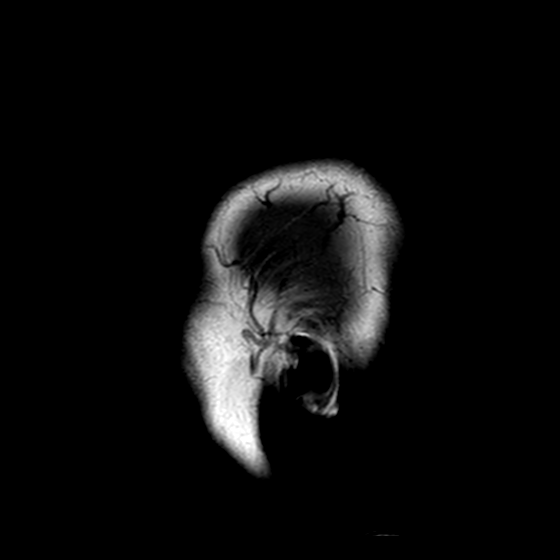

[Series 401: flair_ax+fs · axial · 5.0mm · 0.49mm/px · z∈[-116,+36]mm · 4 of 27 slices shown]
[im 1/27]
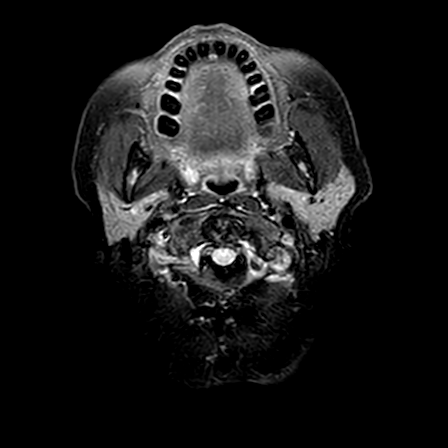
[im 9/27]
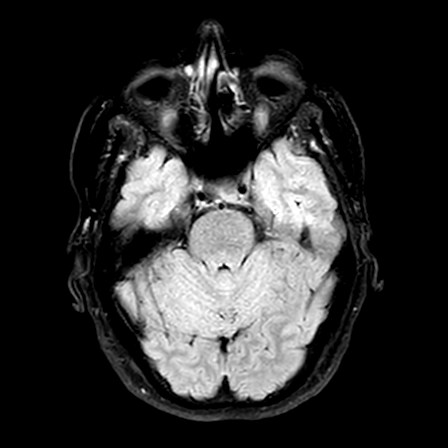
[im 18/27]
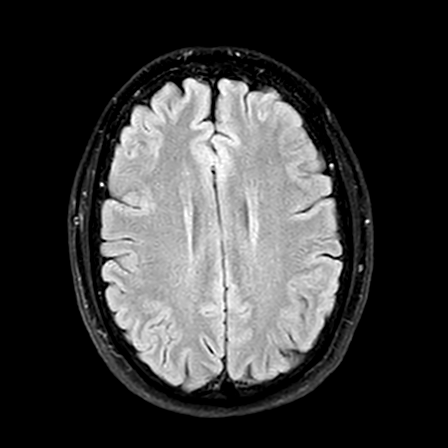
[im 27/27]
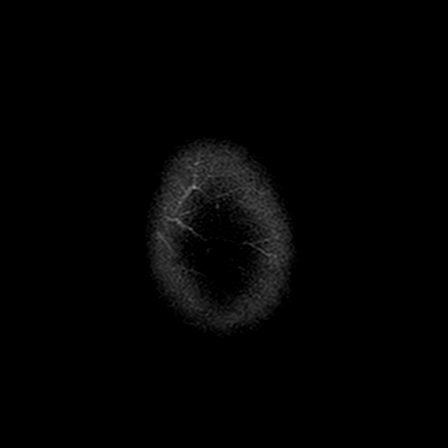

[Series 502: swip · axial · 10.0mm · 0.36mm/px · z∈[-94,-7]mm · 6 of 140 slices shown]
[im 9/140]
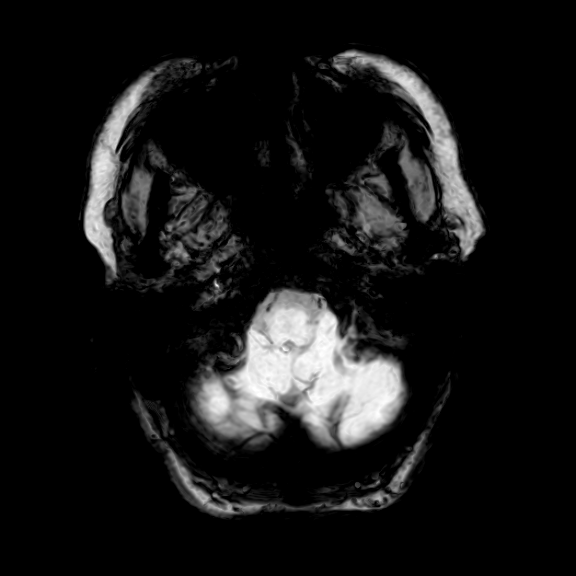
[im 25/140]
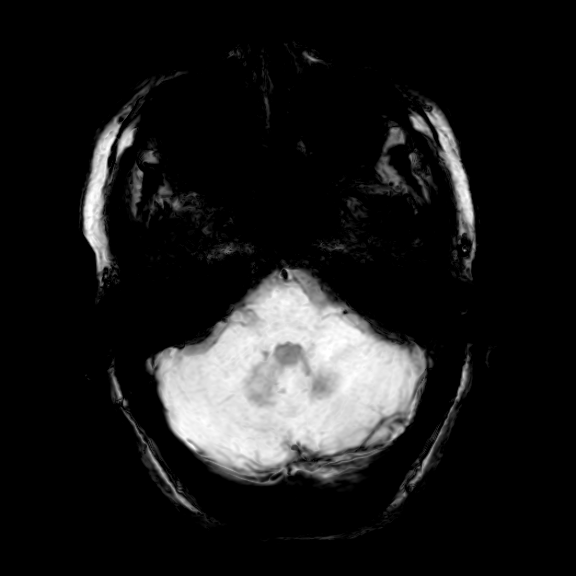
[im 41/140]
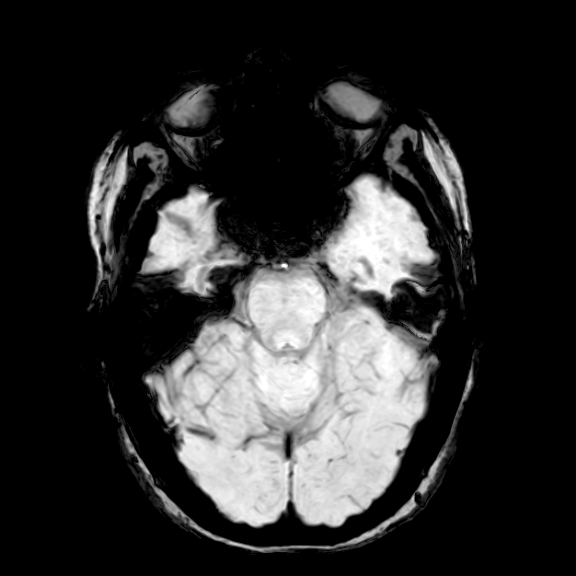
[im 58/140]
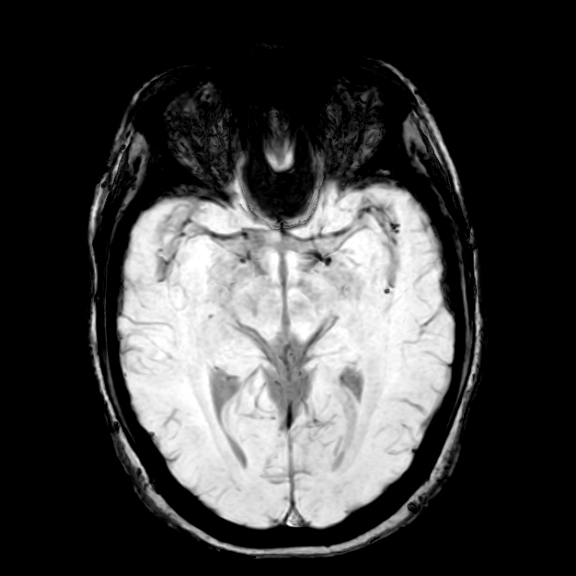
[im 82/140]
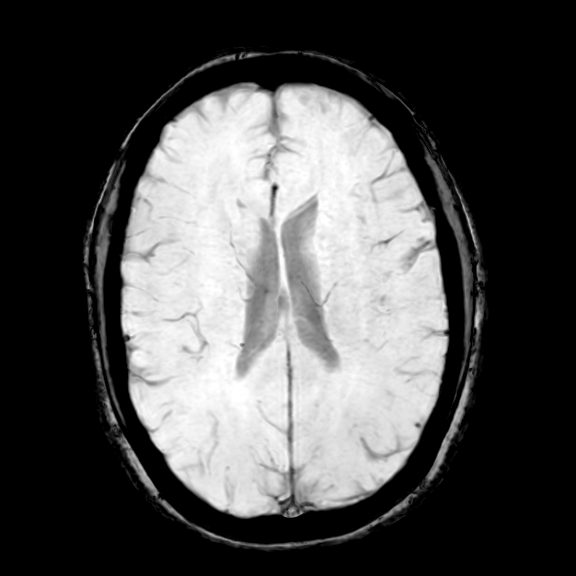
[im 99/140]
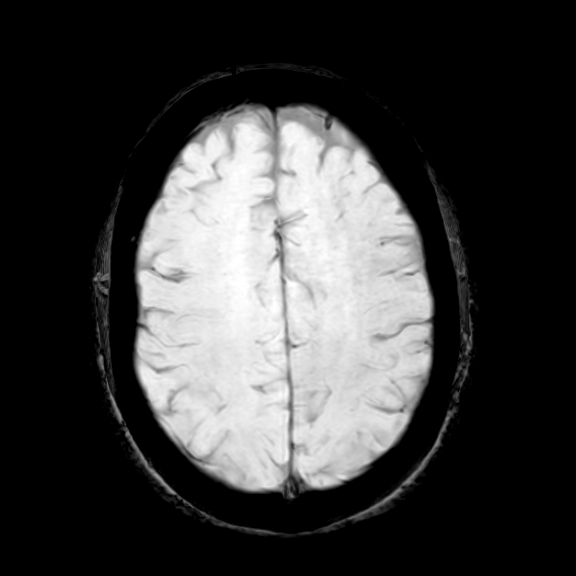

[27 of 48 positions shown; findings below may reference images not displayed]

FINDINGS: There is no intracranial mass. Diffusion images are negative. 
Susceptibility images are negative. There is no cerebellar volume loss. The 
craniocervical junction is open. Sellar contents are intact. Sparse T2 
hyperintense signal foci in the frontal subcortical white matter. There is no 
discrete brainstem or cerebellar lesion. 
Major intracranial arterial segments and dural sinuses are open. There is no 
hydrocephalus. 
No orbital mass. Paranasal sinuses are predominantly clear. The left maxillary 
antrum appears hypoplastic, with mild mucosal thickening. Tympanic cavities and 
mastoid air cells are predominantly clear. Minimal fluid in the left peripheral 
mastoid is most likely chronic.
IMPRESSION: No evidence for infarct, intracranial mass or hydrocephalus. Cerebellum is 
unremarkable. 
Minimal fluid in peripheral left mastoid air cells is likely chronic sequelae of 
prior inflammation. Mastoid antrum and tympanic cavities appear 
well-pneumatized. 
Sparse T2 hyperintense signal foci in the frontal subcortical white matter 
bilaterally, most likely indicating minimal chronic microangiopathy. 
Major arterial segments, including the vertebral and basilar arteries, are open. 
Crowding of the nasal air passage due to turbinate hypertrophy and left conchal 
bullosa. Paranasal sinus CT would be useful for further evaluation if clinically 
warranted.

## 2022-07-30 IMAGING — MR MRI CERVICAL SPINE WITHOUT CONTRAST
7 of 10 series · 9 of 48 positions shown · IV contrast (gadolinium)
Comparison: None

________________________________________________________________________________________________ 
MRI CERVICAL SPINE WITHOUT CONTRAST, 07/30/2022 [DATE]: 
CLINICAL INDICATION: Dizziness. Neck pain.
TECHNIQUE: Multiplanar, multiecho position MR images of the cervical spine were 
performed without intravenous gadolinium enhancement. Patient was scanned on a 
1.5 Tesla magnet.

[Series 101: survey · axial · 10.0mm · 1.25mm/px · 1 of 10 slices shown (1 of 2)]
[im 1/10]
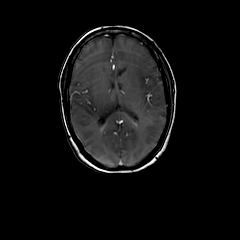

[Series 201: survey · axial · 10.0mm · 1.25mm/px · 1 of 10 slices shown (2 of 2)]
[im 1/10]
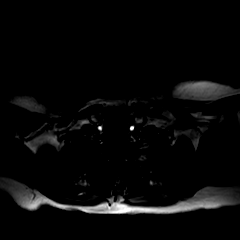

[Series 301: t2w_cor-surv · coronal · 5.0mm · 0.67mm/px · 1 of 7 slices shown]
[im 1/7]
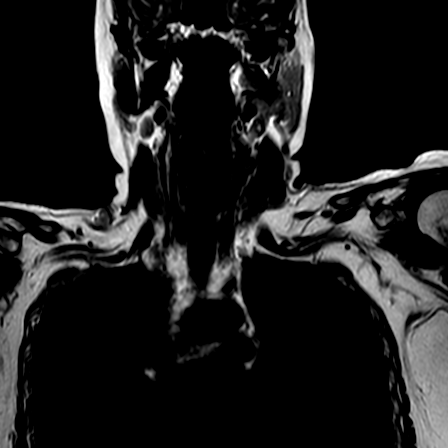

[Series 401: t1_sag · sagittal · 3.0mm · 0.39mm/px · 1 of 15 slices shown]
[im 1/15]
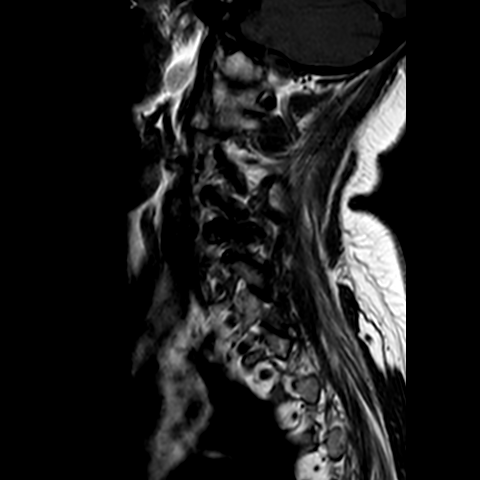

[Series 502: (id)_mdixon_tse · sagittal · 3.0mm · 0.42mm/px · 1 of 15 slices shown]
[im 1/15]
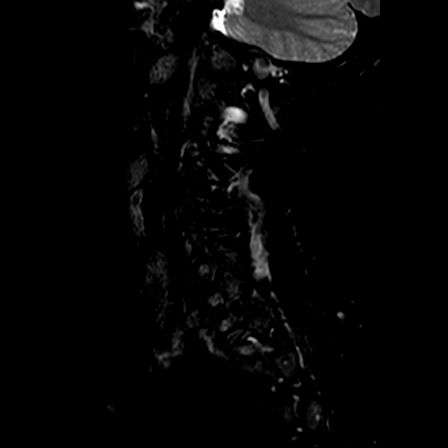

[Series 503: st2w_mdixon_tse · sagittal · 3.0mm · 0.42mm/px · 1 of 15 slices shown]
[im 1/15]
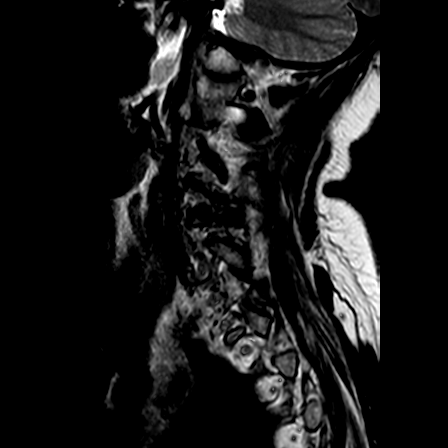

[Series 701: t2_(person_name)_(person_name) · axial · 3.0mm · 0.29mm/px · z∈[-226,-120]mm · 3 of 36 slices shown]
[im 1/36]
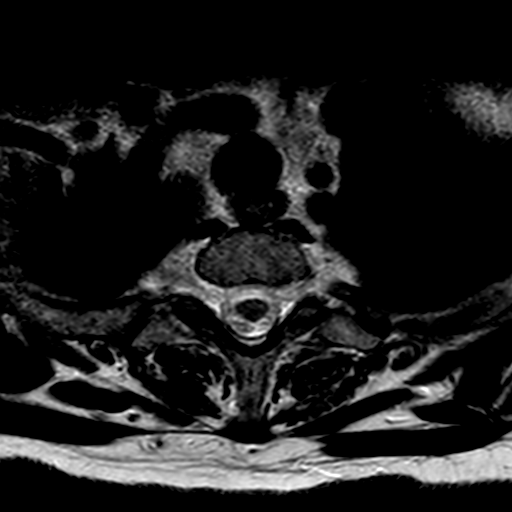
[im 18/36]
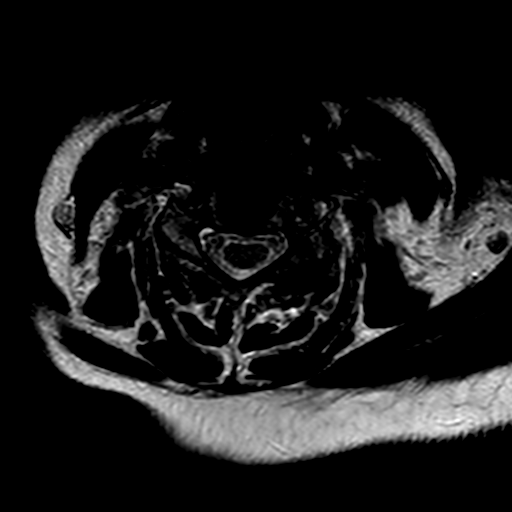
[im 36/36]
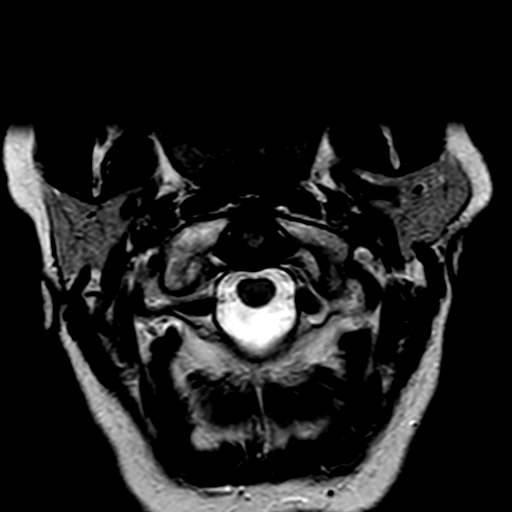

[9 of 48 positions shown; findings below may reference images not displayed]

FINDINGS: -------------------------------------------------------------------------------- 
----------------- 
GENERAL: 
ALIGNMENT: Minimal dextroconvex cervical thoracic scoliosis. Loss of the normal 
cervical lordosis with trace grade 1 anterolisthesis C3 on C4, C4 on C5 and C5 
on C6. 
VERTEBRAL BODY HEIGHT: Normal.  
MARROW SIGNAL: No focal suspect signal abnormality. T2 hemangioma. 
CORD SIGNAL: Normal.  
ADDITIONAL FINDINGS: None. 
-------------------------------------------------------------------------------- 
---------------- 
SEGMENTAL: 
CRANIOCERVICAL JUNCTION: No significant stenosis. 
C2-C3: Normal disc height. Mild posterior ligamentous hypertrophy. Canal and 
foramina are patent. Left greater than right facet arthropathy. 
C3-C4: Normal disc height. Canal and foramina are patent. Facet arthropathy. 
C4-C5: Mild loss of disc height. Canal and left foramen are patent. Mild right 
foraminal narrowing. Facet arthropathy worse on the left. 
C5-C6: Mild loss of disc height. Canal and foramina are patent. Mild facet 
arthropathy. 
C6-C7: Mild loss of disc height. Minimal generalized disc osteophyte complex. 
Canal and right foramen are patent. There is moderate to severe left foraminal 
narrowing with left-sided uncinate spurring. Mild facet arthropathy. 
C7-T1: Normal disc height. No herniation. Normal facets. No spinal canal or 
neural foraminal stenosis. 
Visualized upper thoracic segments unremarkable. 
-------------------------------------------------------------------------------- 
---------------
IMPRESSION: Mild cervical degenerative changes, most significant at C6-C7 where there is 
moderate to severe left foraminal narrowing.

## 2022-08-18 IMAGING — MR MRI LEFT KNEE WITHOUT CONTRAST
4 of 5 series · 25 of 40 positions shown · IV contrast (gadolinium)
Comparison: None

________________________________________________________________________________________________ 
MRI LEFT KNEE WITHOUT CONTRAST, 08/18/2022 [DATE]: 
CLINICAL INDICATION: History of meniscal tear.
TECHNIQUE: Multiplanar, multiecho position MR images of the knee were performed 
without intravenous gadolinium enhancement. Patient was scanned on a
magnet.

[Series 301: PD fat-sat · axial · left · 3.0mm · 0.36mm/px · z∈[-40,+83]mm · 8 of 36 slices shown (1 of 3)]
[im 1/36]
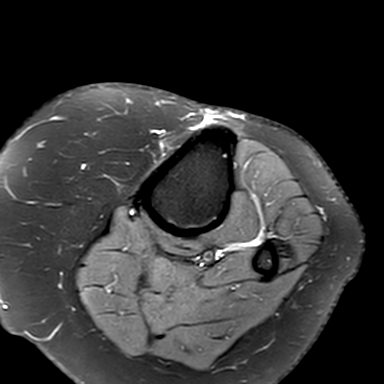
[im 4/36]
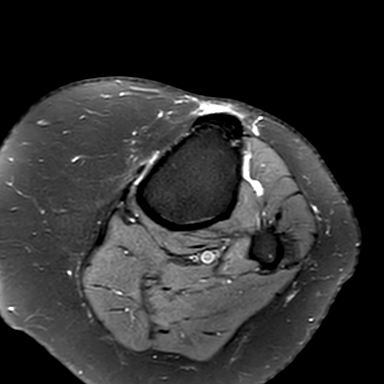
[im 12/36]
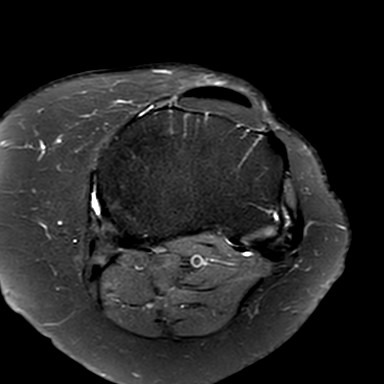
[im 16/36]
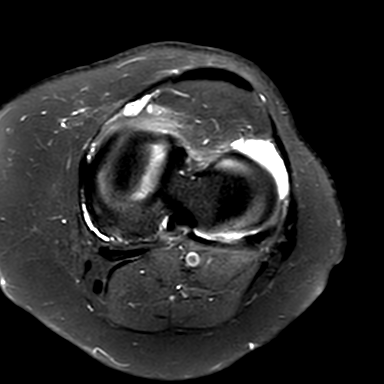
[im 20/36]
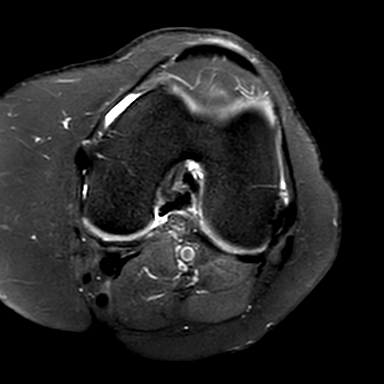
[im 24/36]
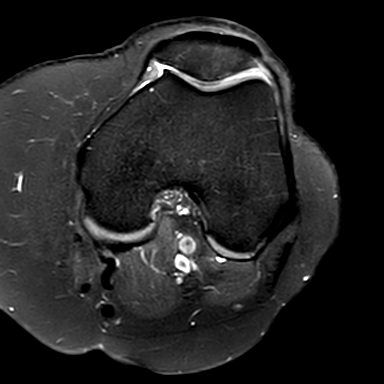
[im 32/36]
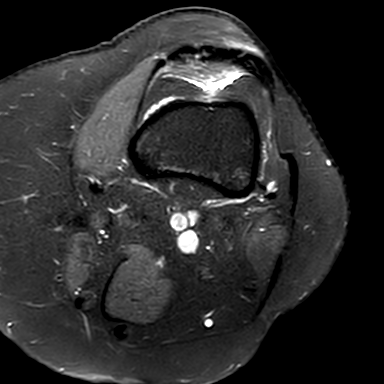
[im 36/36]
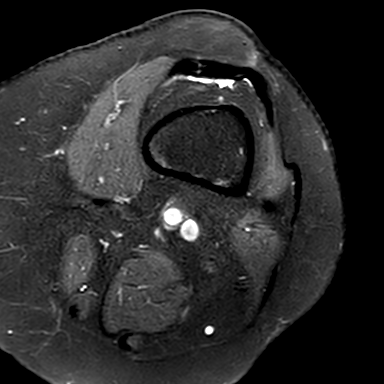

[Series 401: PD fat-sat · sagittal · left · 3.0mm · 0.29mm/px · 9 of 32 slices shown (2 of 3)]
[im 1/32]
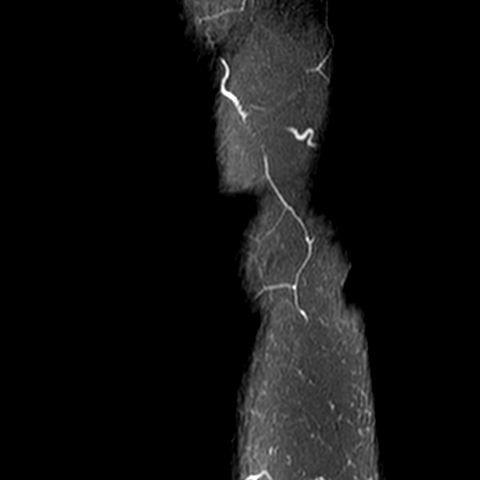
[im 4/32]
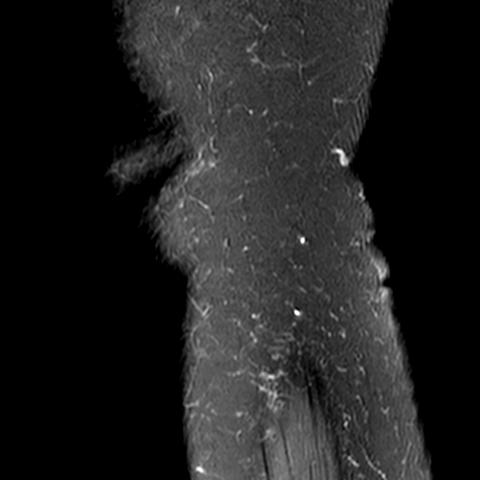
[im 8/32]
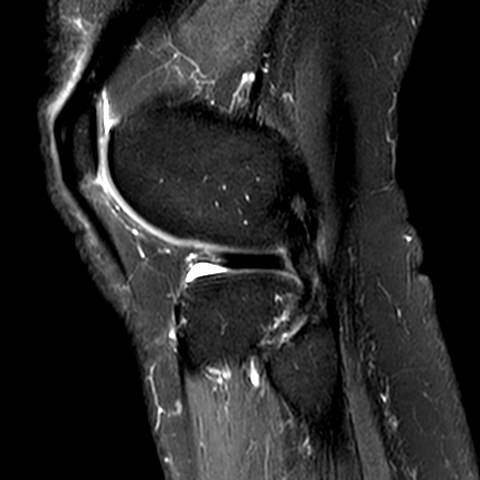
[im 12/32]
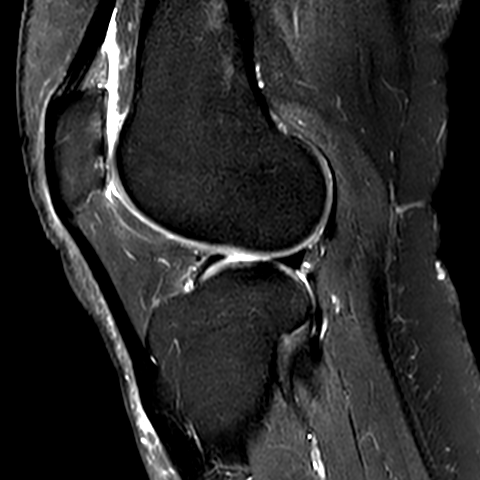
[im 16/32]
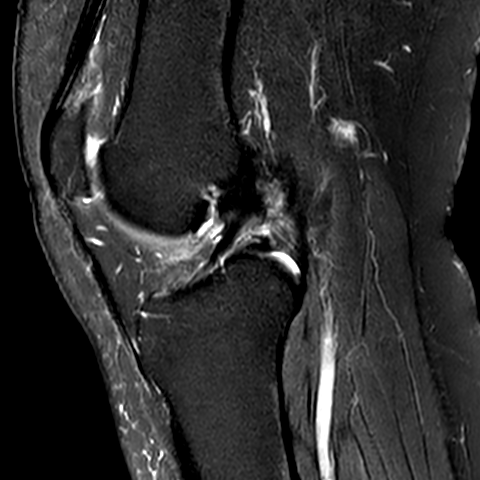
[im 20/32]
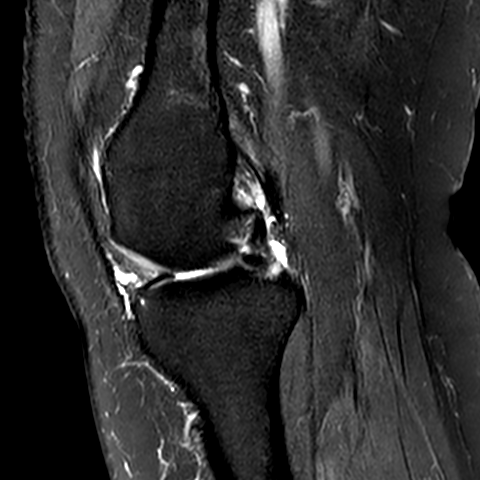
[im 24/32]
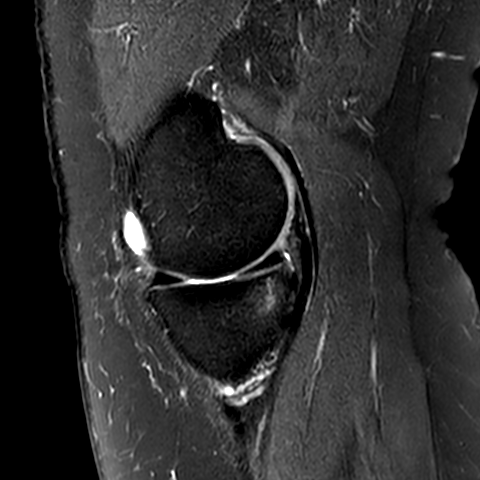
[im 28/32]
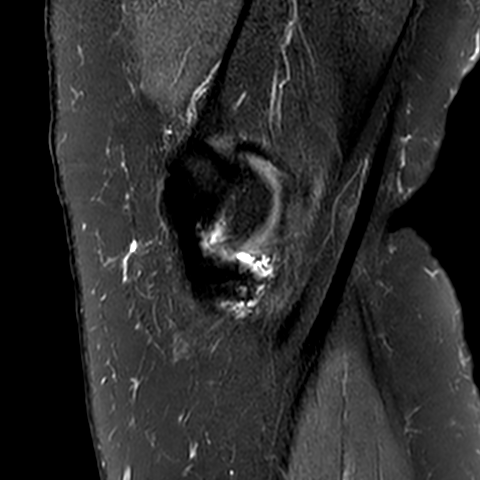
[im 32/32]
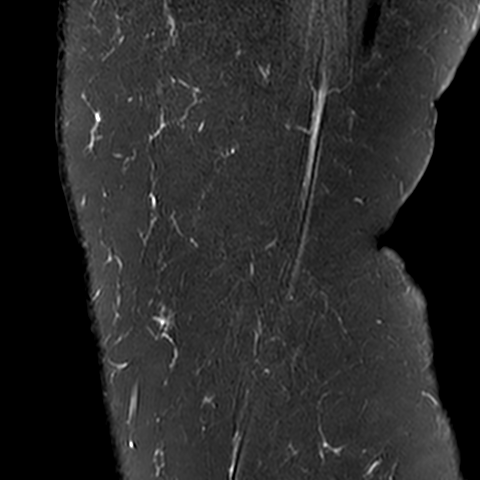

[Series 501: T1 · coronal · left · 3.0mm · 0.31mm/px · 3 of 32 slices shown]
[im 4/32]
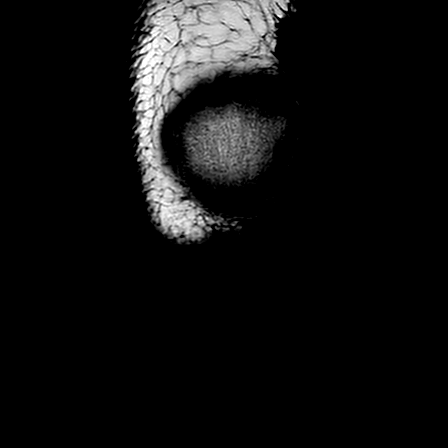
[im 16/32]
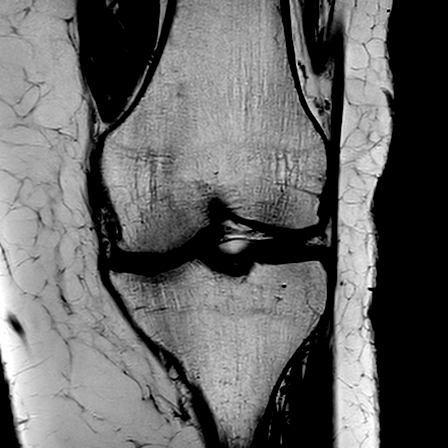
[im 28/32]
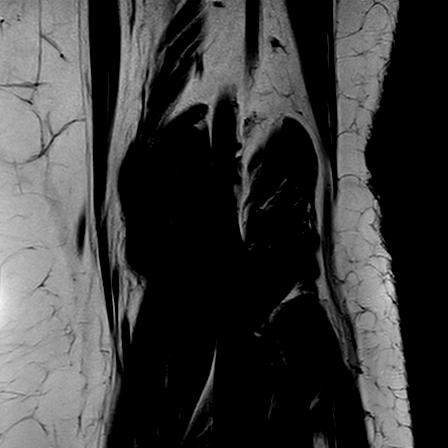

[Series 601: PD fat-sat · coronal · left · 3.0mm · 0.31mm/px · 5 of 32 slices shown (3 of 3)]
[im 1/32]
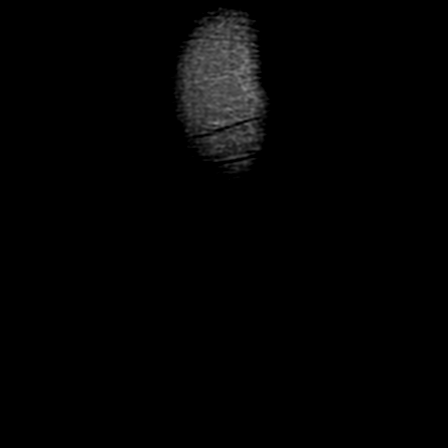
[im 4/32]
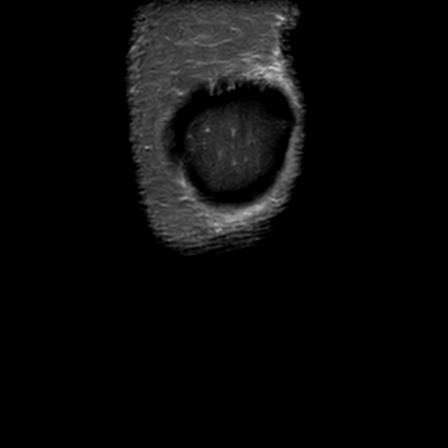
[im 8/32]
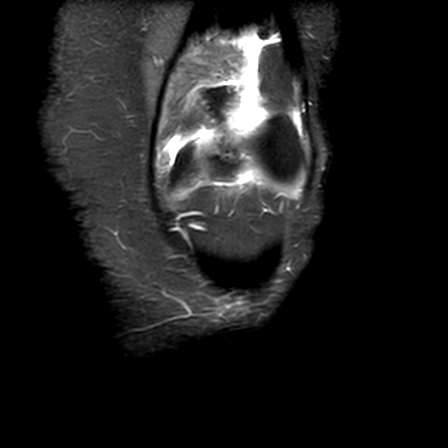
[im 16/32]
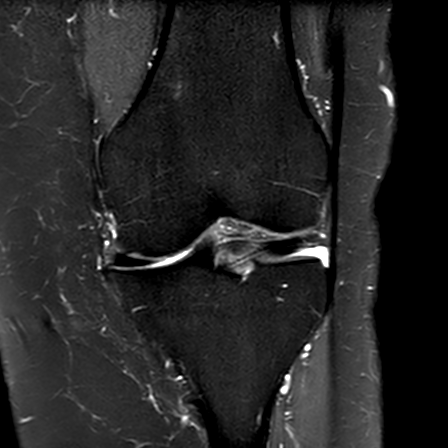
[im 28/32]
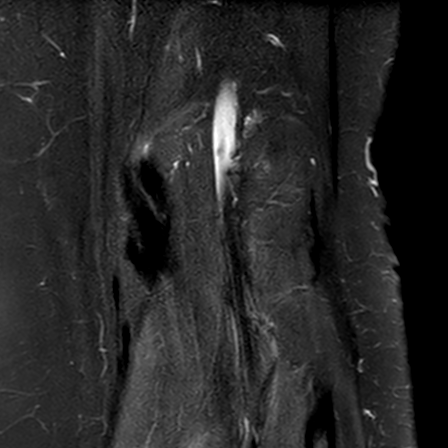

[25 of 40 positions shown; findings below may reference images not displayed]

FINDINGS: MEDIAL COMPARTMENT: Horizontal tear of the posterior medial meniscus, truncation 
of the inner margin of the body and small multiloculated parameniscal cysts 
about the posterior horn. Grade II-III chondromalacia. Subcortical cystic change 
of the medial tibial rim. Tiny osteophytes. 
LATERAL COMPARTMENT: The lateral meniscus is intact without tear or extrusion. 
Articular cartilage is preserved. 
PATELLOFEMORAL COMPARTMENT: The patella is centrally located. Up to grade IV 
chondromalacia and subcortical cystic change of the patellar apex and medial 
trochlea.  
TIBIOFIBULAR COMPARTMENT: Negative. 
LIGAMENTS: The anterior cruciate ligament is intact. The posterior cruciate 
ligament is intact. The medial collateral ligament and lateral collateral 
ligaments are preserved. 
EXTENSOR MECHANISM: The quadriceps and patellar tendon are preserved. The medial 
and lateral retinacula are intact. 
POSTEROMEDIAL CORNER: The semimembranosus and pes anserine tendons are 
preserved. The posterior oblique ligament and posterior medial joint capsule are 
intact. 
POSTEROLATERAL CORNER: The popliteal tendon and popliteofibular ligament are 
intact. The biceps femoris is negative. 
BONES: No fracture or contusion or stress response.  
ADDITIONAL FINDINGS: No knee joint effusion. 1.2 x 0.8 x 1.3 cm multiloculated 
ganglion in the medial [place]wood fat pad. No popliteal cyst. The musculature is 
normal without mass, signal abnormality or atrophy. Neurovascular bundles are 
negative. Minimal anterior subcutaneous soft tissue swelling.
IMPRESSION: 1.  Horizontal tear of the posterior medial meniscus, truncation of the body and 
small multiloculated parameniscal cysts.  
2.  Moderate medial compartment and patellofemoral joint chondromalacia. 
3.  1.2 x 0.8 x 1.3 cm multiloculated ganglion in the medial [place] fat pad.

## 2022-08-18 IMAGING — CT CT CALCIUM SCORING
1 series · 15 of 20 positions shown, 19 images · non-contrast
Comparison: There are no previous exams available for comparison.

________________________________________________________________________________________________ 
CT CALCIUM SCORING, 08/18/2022 [DATE]:
INDICATION: [facility]

[Series 2: calscore · axial · 0.40mm/px · z∈[-260,-138]mm · 15 of 91 slices shown, 19 images]
[im 5/91  vessel]
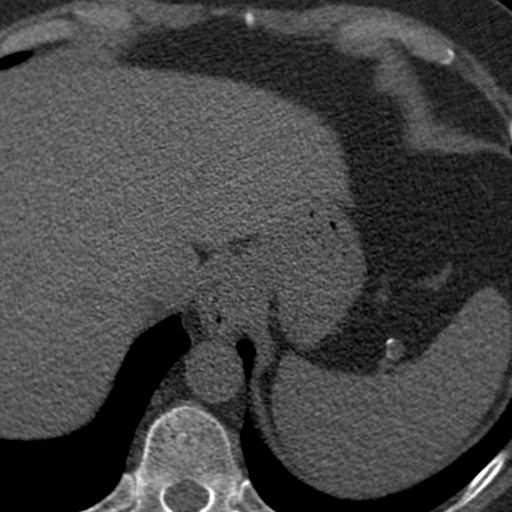
[im 5/91  lung]
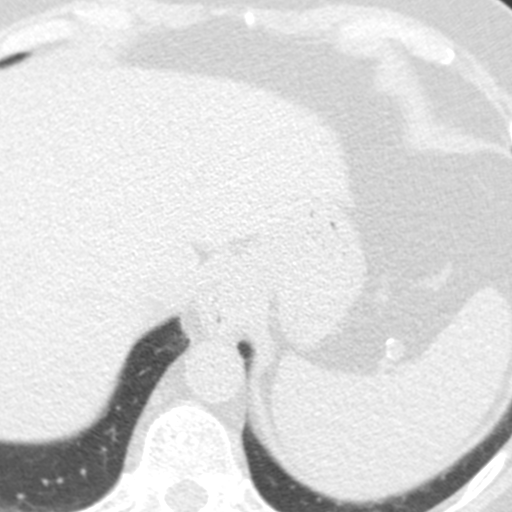
[im 10/91  vessel]
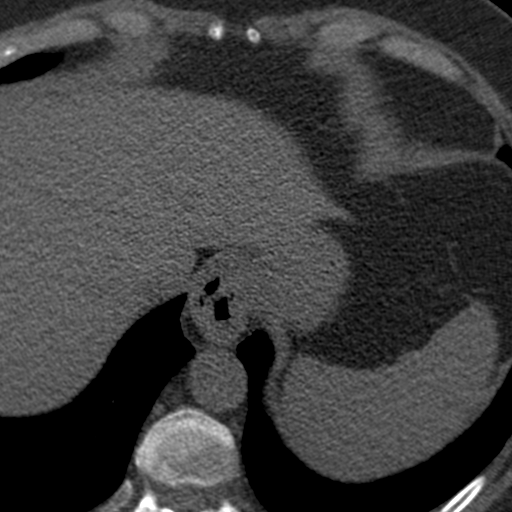
[im 19/91  vessel]
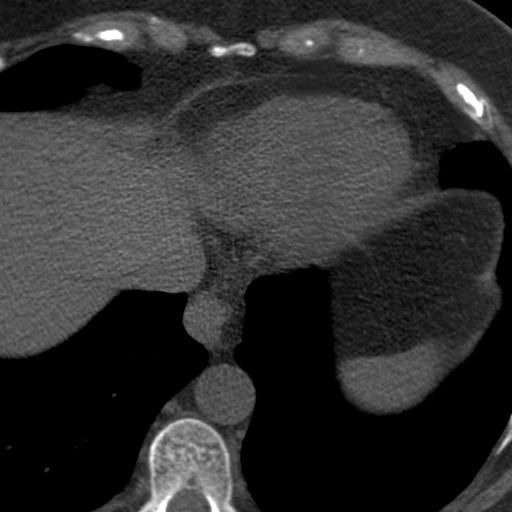
[im 24/91  vessel]
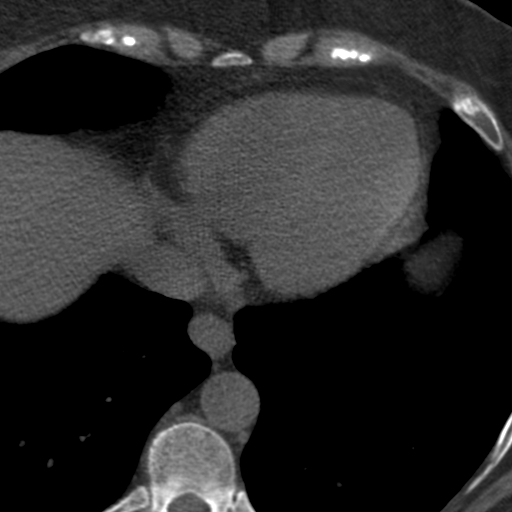
[im 29/91  vessel]
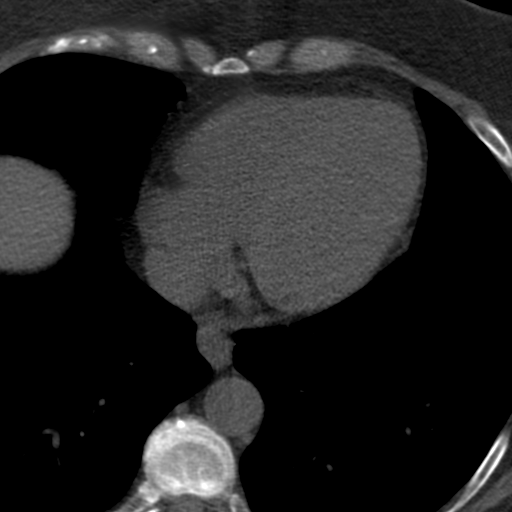
[im 29/91  lung]
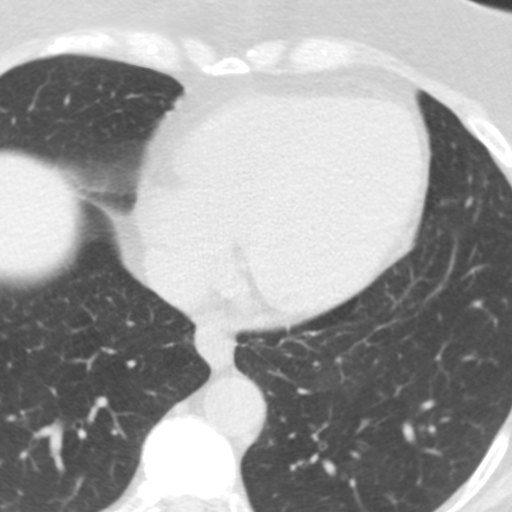
[im 34/91  vessel]
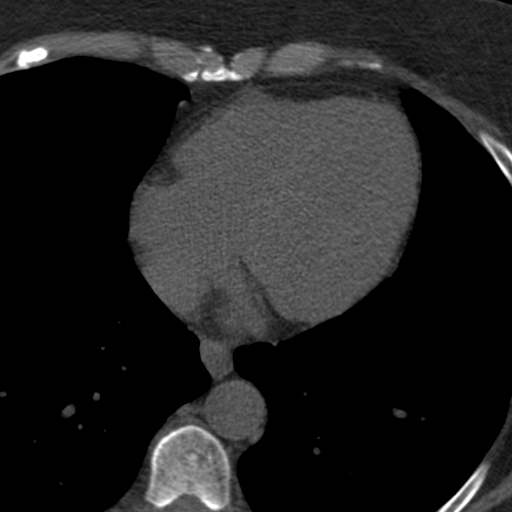
[im 38/91  vessel]
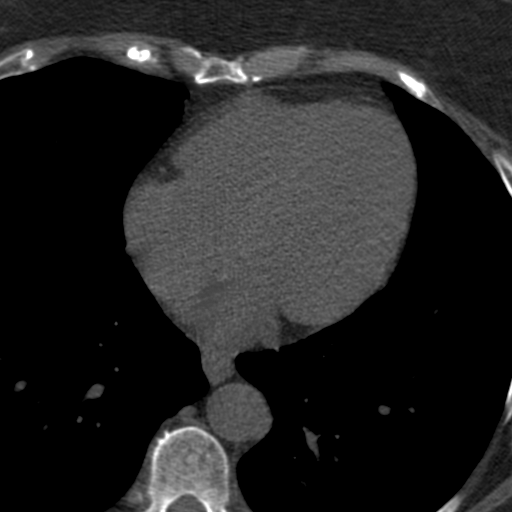
[im 48/91  vessel]
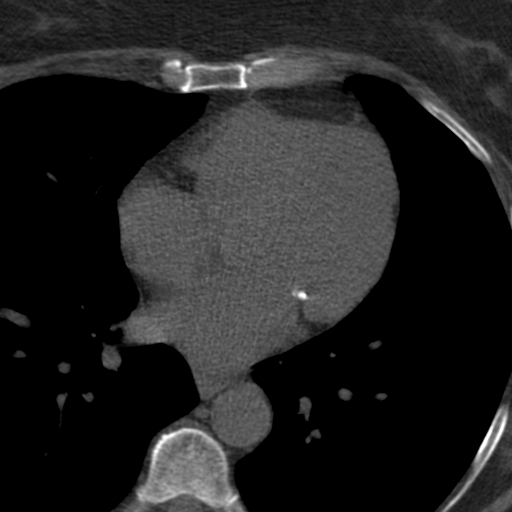
[im 53/91  vessel]
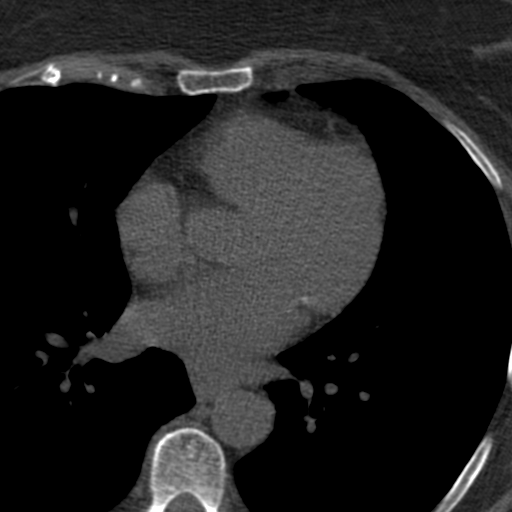
[im 53/91  lung]
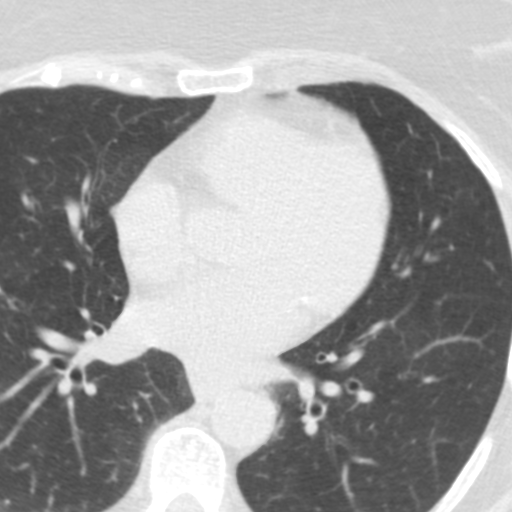
[im 57/91  vessel]
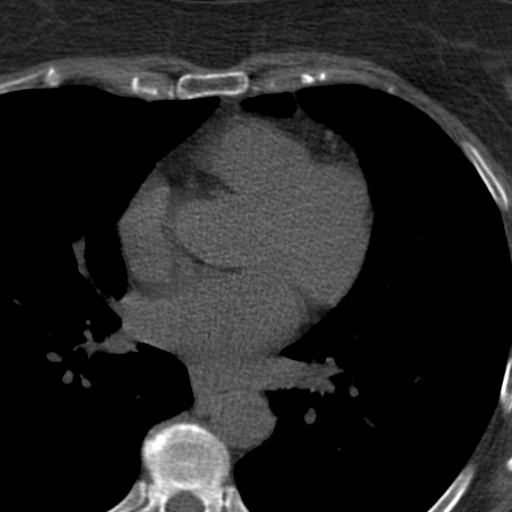
[im 62/91  vessel]
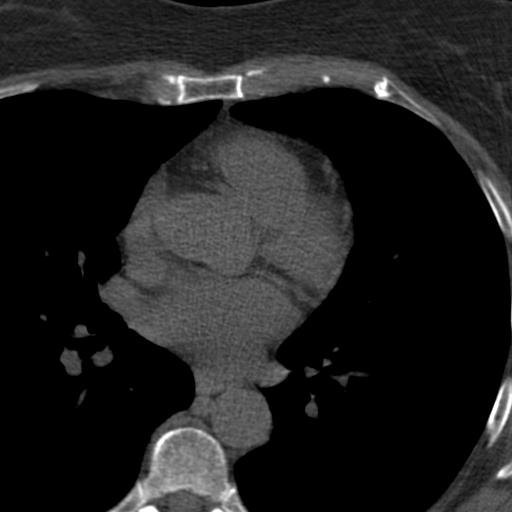
[im 67/91  vessel]
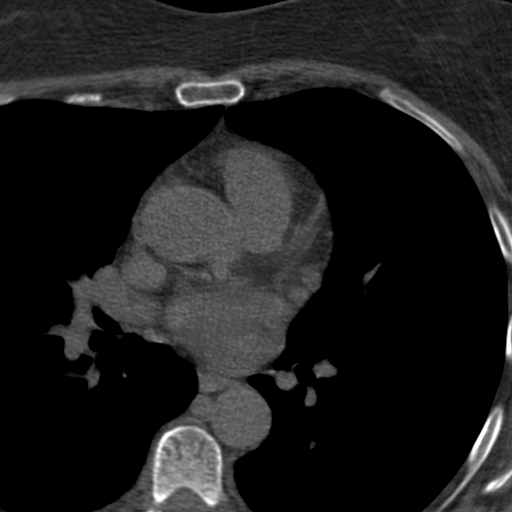
[im 76/91  vessel]
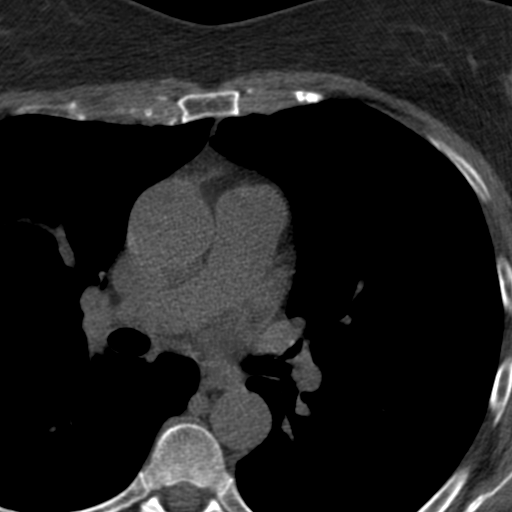
[im 76/91  lung]
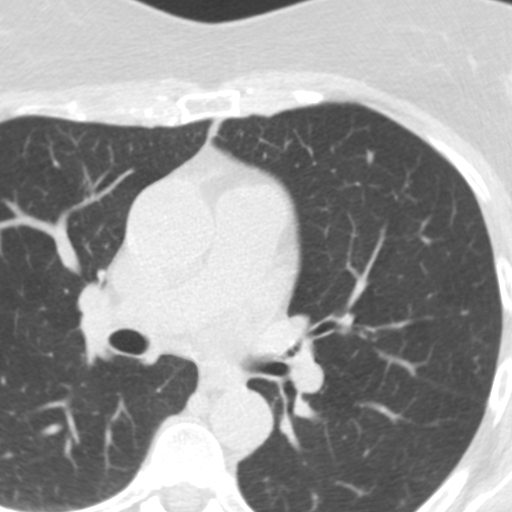
[im 81/91  vessel]
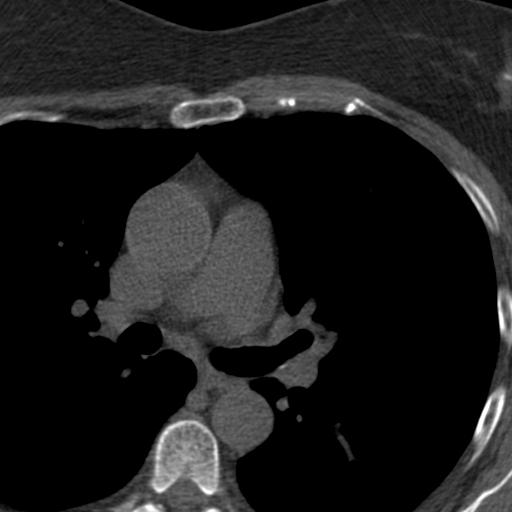
[im 86/91  vessel]
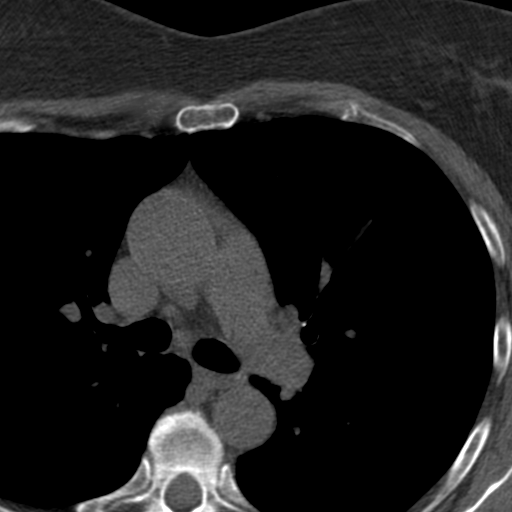

[15 of 20 positions shown; findings below may reference images not displayed]

FINDINGS: Visualized lungs are clear.
IMPRESSION: Total Calcium Score: 1 
No ancillary findings. 
Calcium score                                     Presence of Plaque 
0                                                    No evidence of plaque 
1-10                                               Minimal evidence of plaque 
11-100                                            Mild evidence of plaque 
101-400                                          Moderate evidence of plaque 
Over 400                                        Extensive evidence of plaque     

Calcium scoring sheets to follow. 
RADIATION DOSE REDUCTION: All CT scans are performed using radiation dose 
reduction techniques, when applicable.  Technical factors are evaluated and 
adjusted to ensure appropriate moderation of exposure.  Automated dose 
management technology is applied to adjust the radiation doses to minimize 
exposure while achieving diagnostic quality images.

## 2022-08-18 IMAGING — MG MAMMOGRAPHY SCREENING BILATERAL 3[PERSON_NAME]
8 series · 8 of 24 positions shown · non-contrast
Comparison: No prior exams available for comparison at the time of this 
dictation.

________________________________________________________________________________________________ 
******** ADDENDUM #1 ********/n 
Addendum is performed for comparison with prior exams. Comparison is made with 
02/15/2021 and exams dating back to 09/29/2015.
TECHNIQUE: Digital bilateral mammograms and 3-D Tomosynthesis were obtained. 
These were interpreted both primarily and with the aid of computer-aided 
detection system.  
BREAST DENSITY: (Level B) There are scattered areas of fibroglandular density.

[L CC]
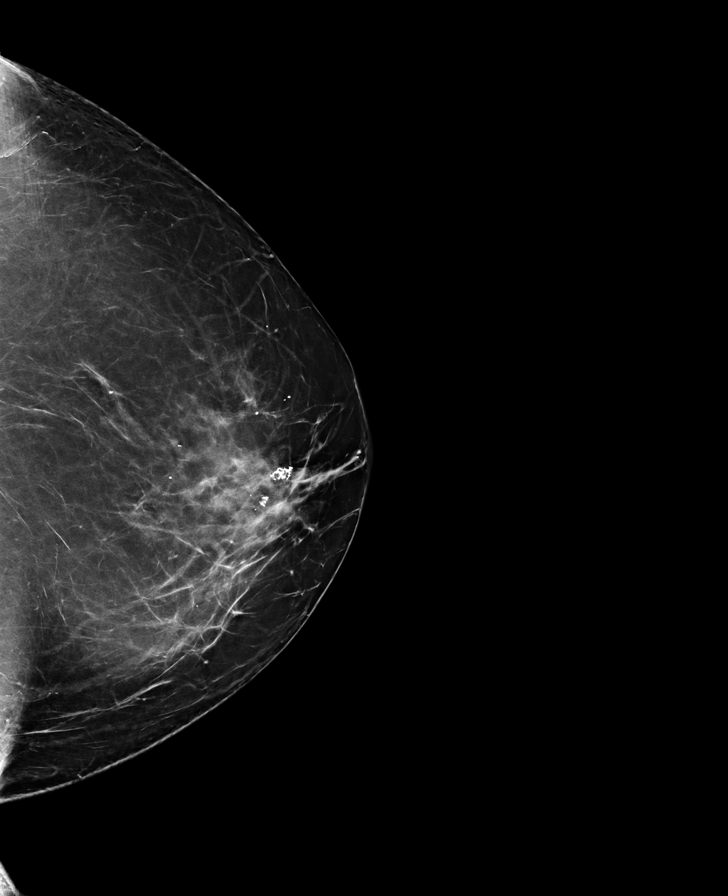

[R MLO]
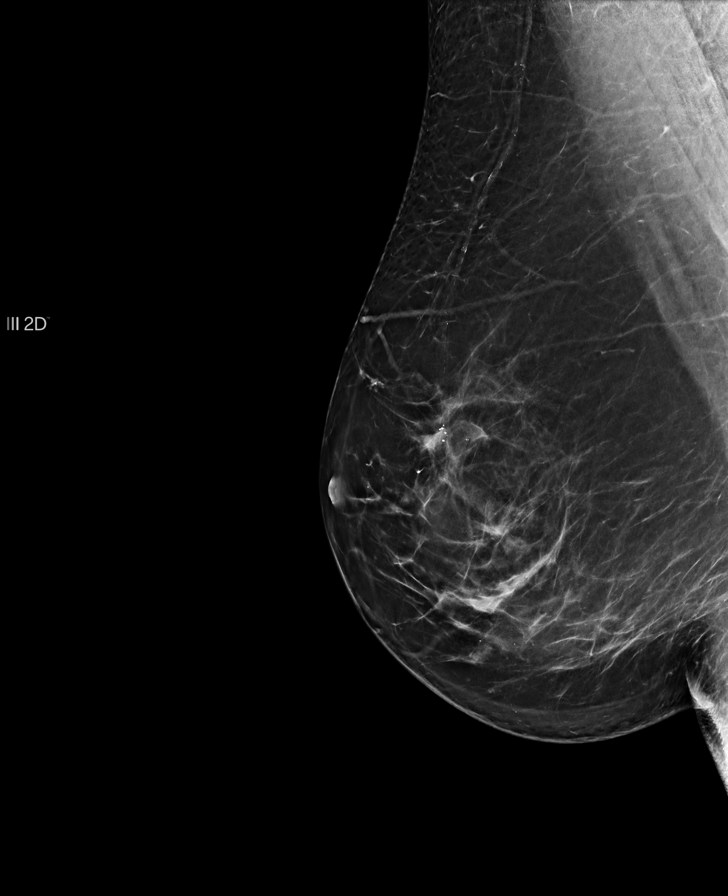

[L MLO]
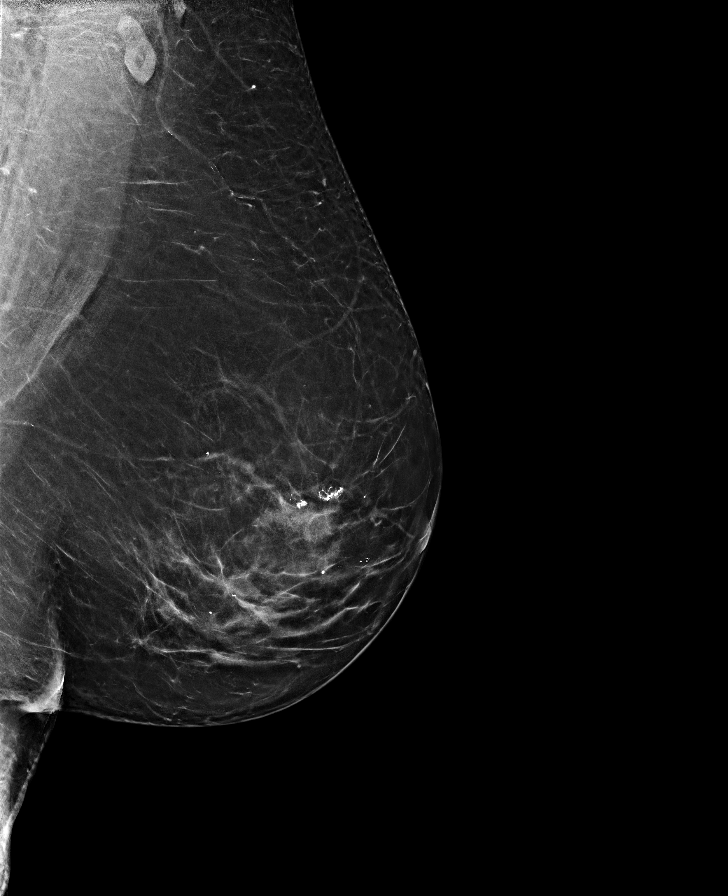

[R CC]
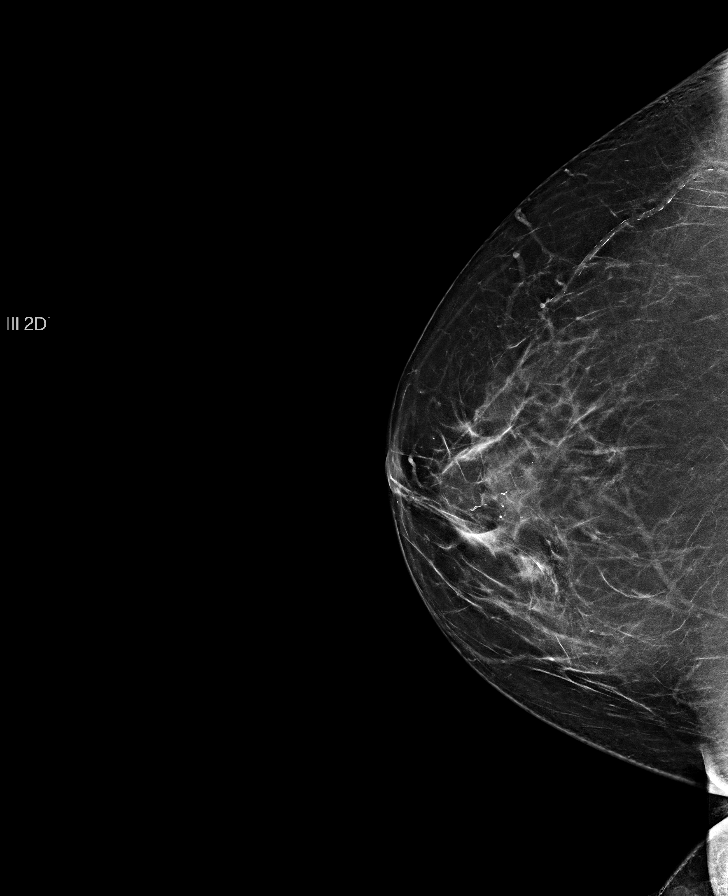

[R MLO tomo · tomo slice 47/93.0]
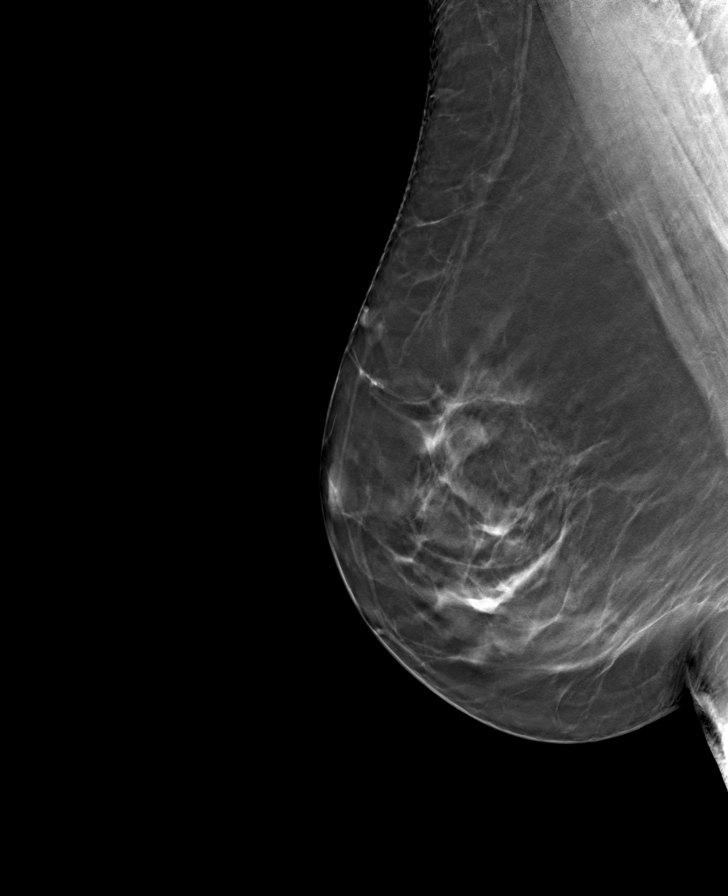

[L MLO tomo · tomo slice 47/93.0]
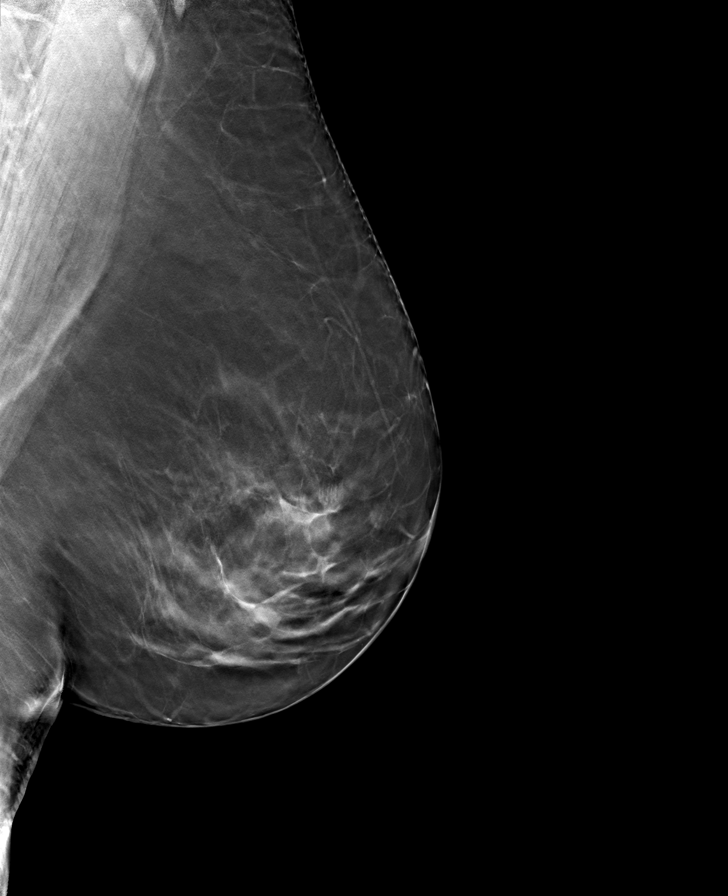

[L CC tomo · tomo slice 46/91.0]
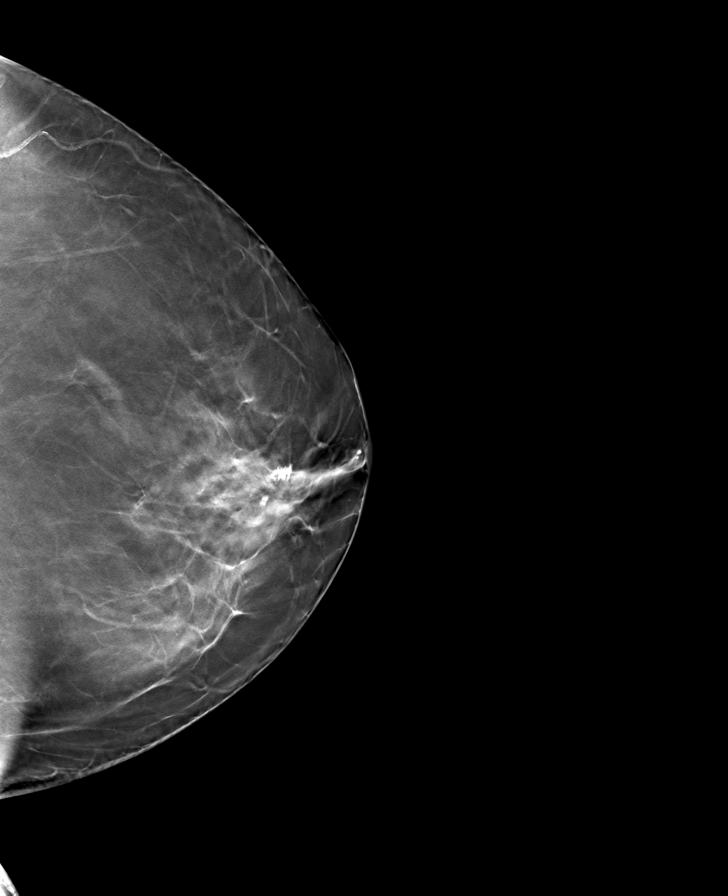

[R CC tomo · tomo slice 45/89.0]
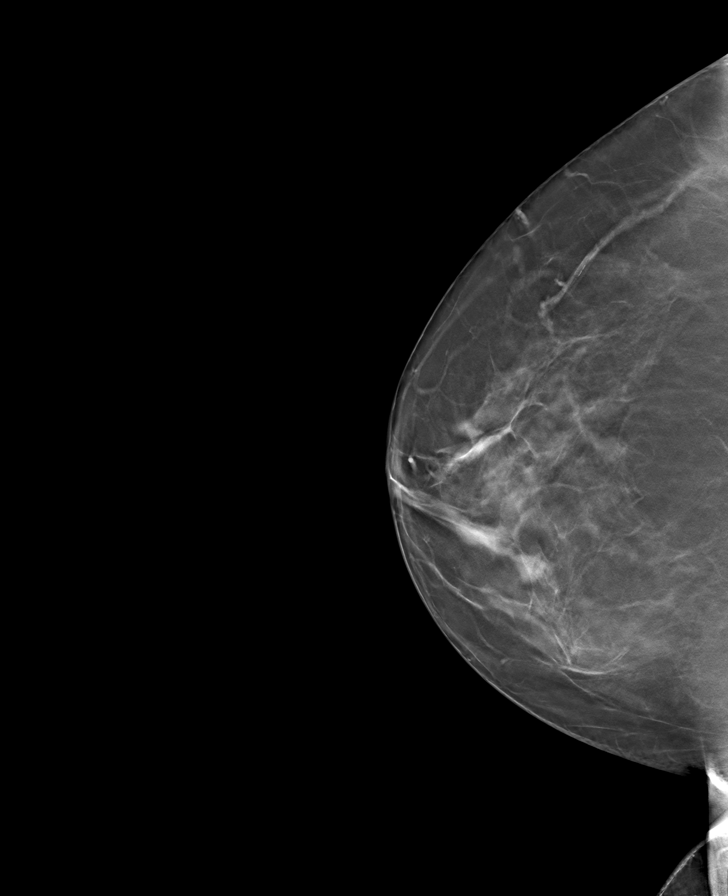

[8 of 24 positions shown; findings below may reference images not displayed]

IMPRESSION: Stable mammographic appearance. No mammographic findings suggestive 
for malignancy. 
(BI-RADS 2) Benign findings. Routine mammographic follow-up is recommended. 
MAMMOGRAPHY SCREENING BILATERAL 3NIELS-JAKOB KOLDING, 08/18/2022 [DATE]: 
CLINICAL INDICATION: Encounter for screening mammogram. History of prior breast 
lift.
FINDINGS: Appearance of the breast parenchyma correlates with patients history 
of prior bilateral breast lift. No suspicious mass, calcifications, or area of 
architectural distortion in either breast.
IMPRESSION: No mammographic findings suggestive for malignancy. If prior mammograms become 
available for comparison an addendum will be performed at that time. 
(BI-RADS 2) Benign findings. Routine mammographic follow-up is recommended.

## 2023-09-13 IMAGING — MG MAMMOGRAPHY SCREENING BILATERAL 3[PERSON_NAME]
8 series · 8 of 24 positions shown · non-contrast
Comparison: 08/18/2022 and exams dating back to 09/29/2015

________________________________________________________________________________________________ 
MAMMOGRAPHY SCREENING BILATERAL 3NOLA OXENDINE, 09/13/2023 [DATE]: 
CLINICAL INDICATION: Encounter for screening mammogram. History of breast lift 
to 6 years ago.
TECHNIQUE: Digital bilateral mammograms and 3-D Tomosynthesis were obtained. 
These were interpreted both primarily and with the aid of computer-aided 
detection system.  
BREAST DENSITY: (Level B) There are scattered areas of fibroglandular density.

[R CC]
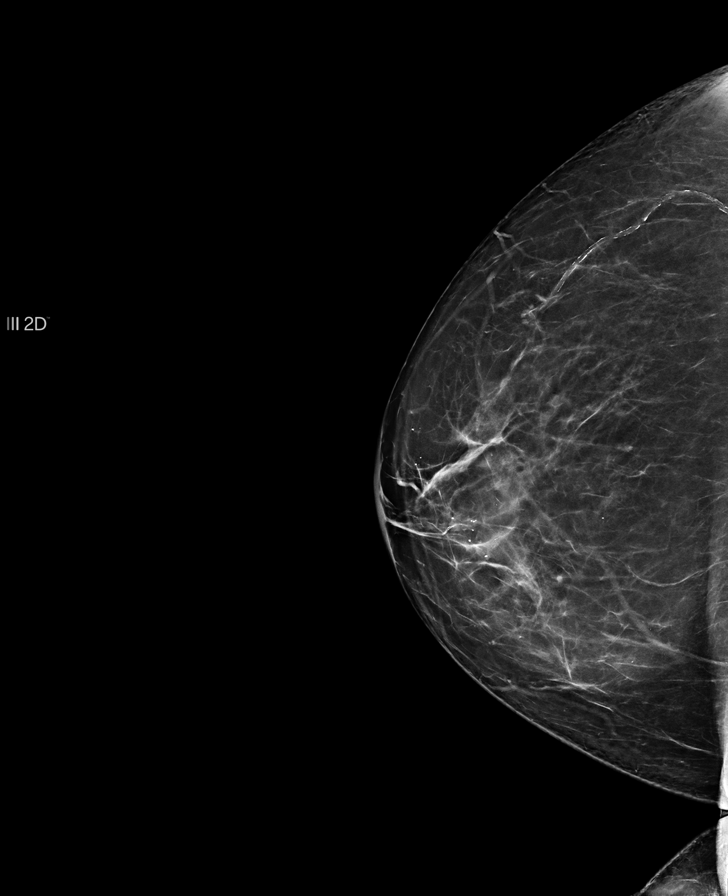

[L MLO]
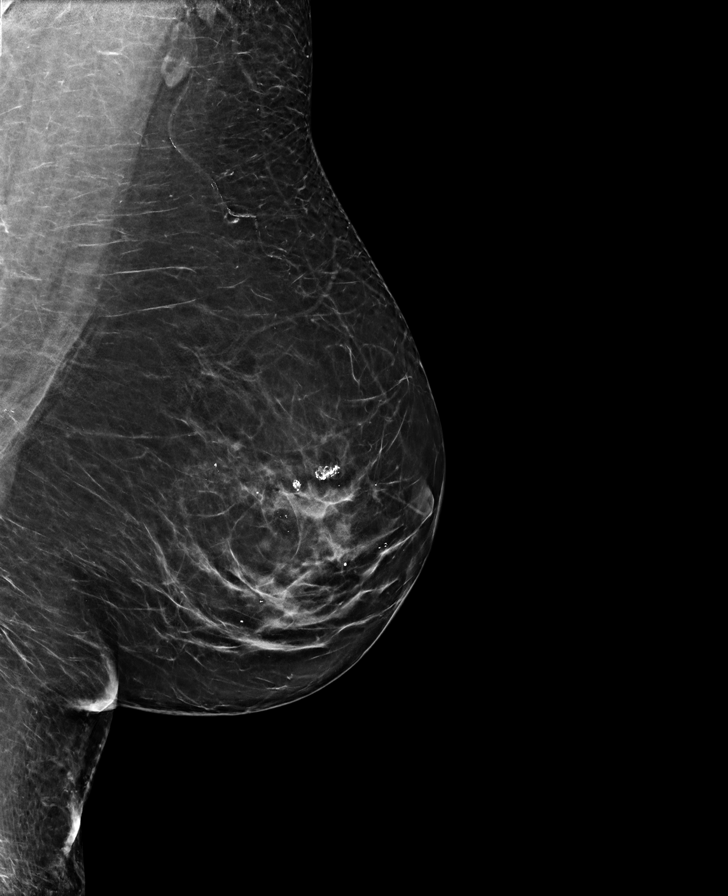

[L CC]
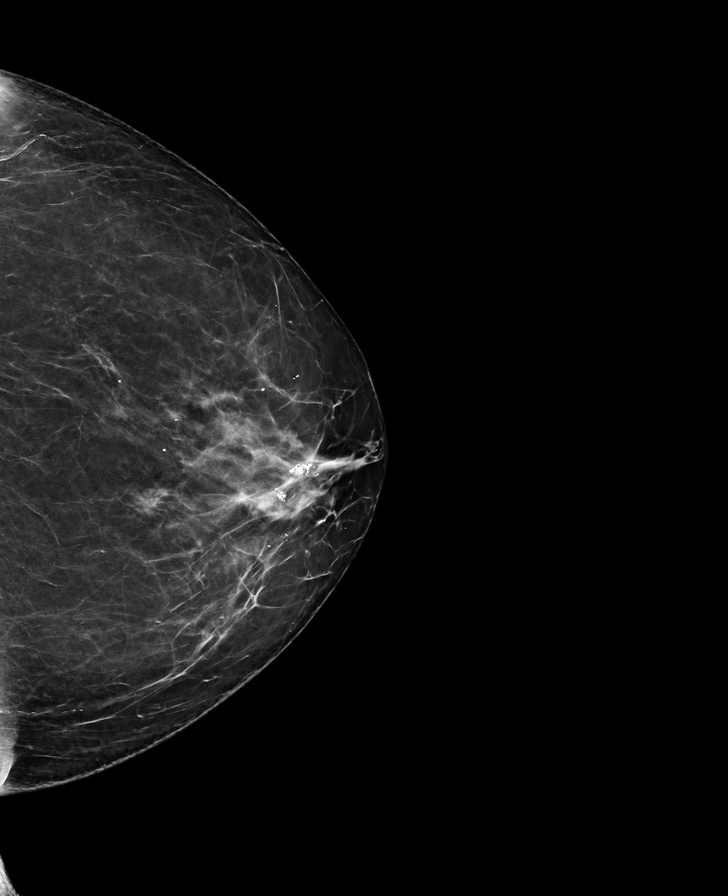

[R MLO]
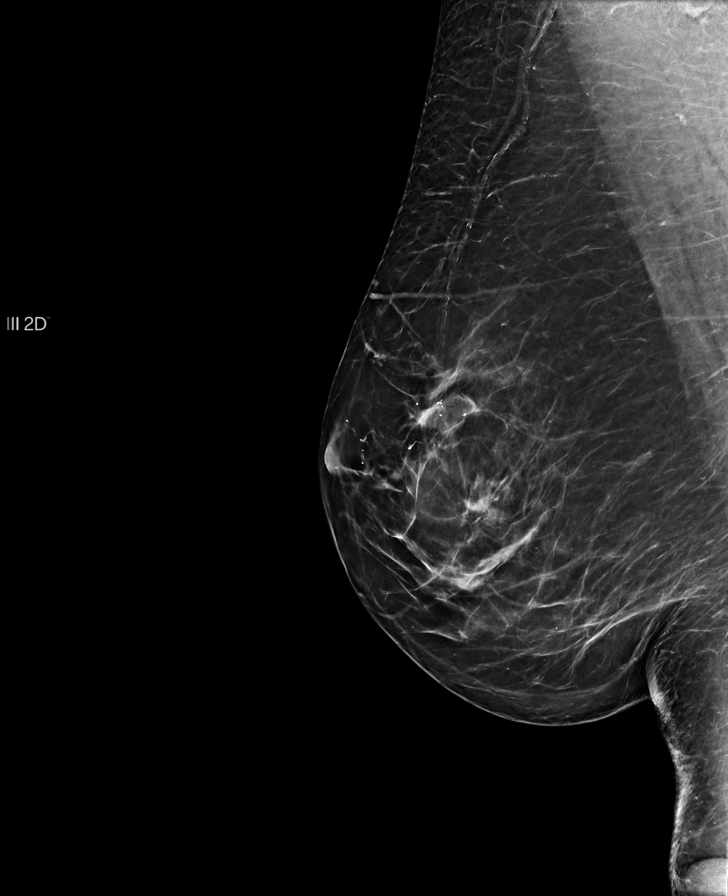

[L MLO tomo · tomo slice 14/27.0]
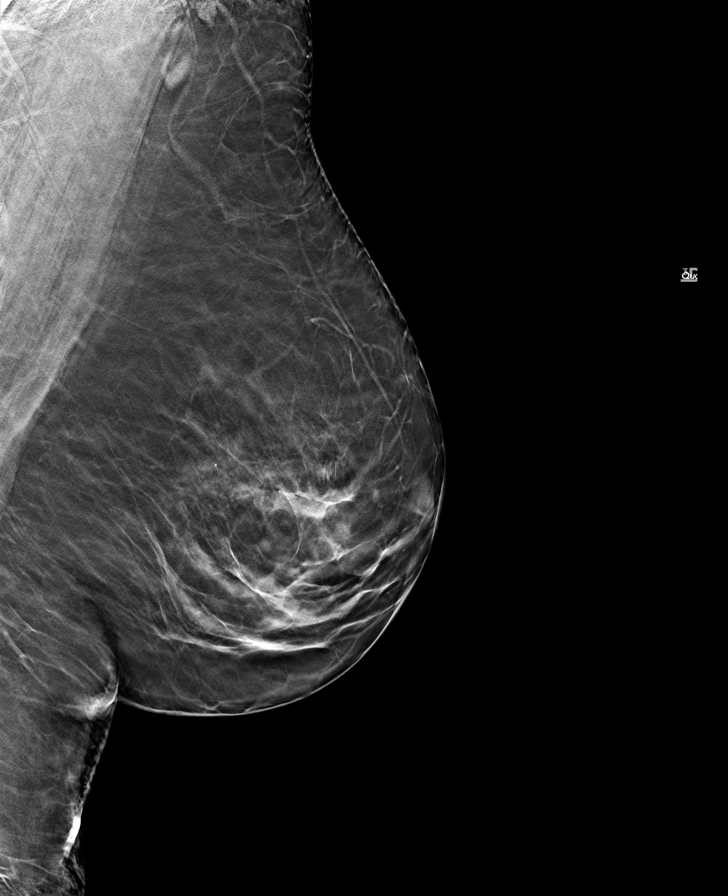

[R MLO tomo · tomo slice 15/28.0]
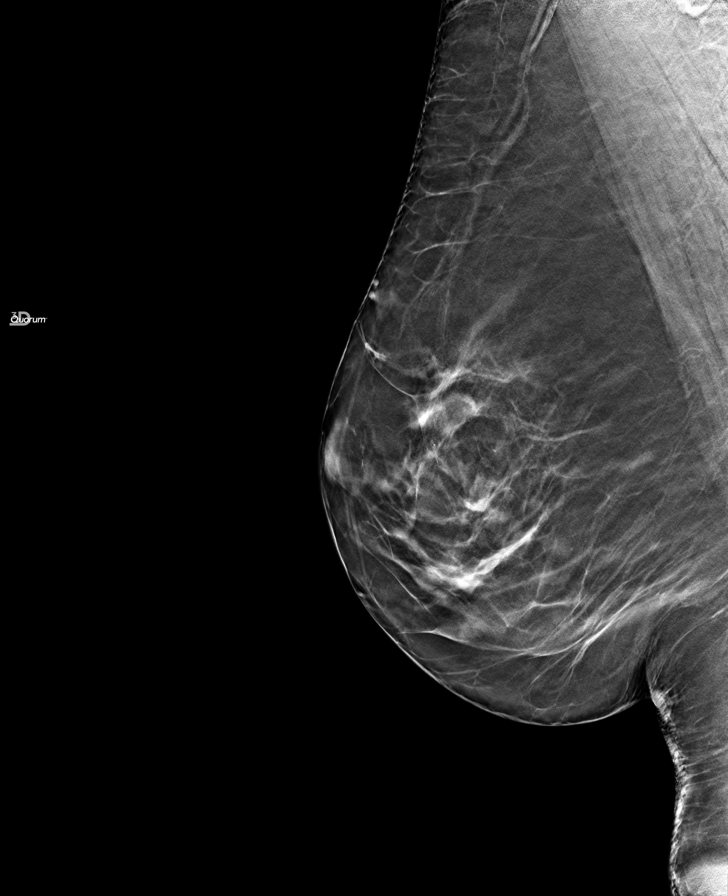

[L CC tomo · tomo slice 13/25.0]
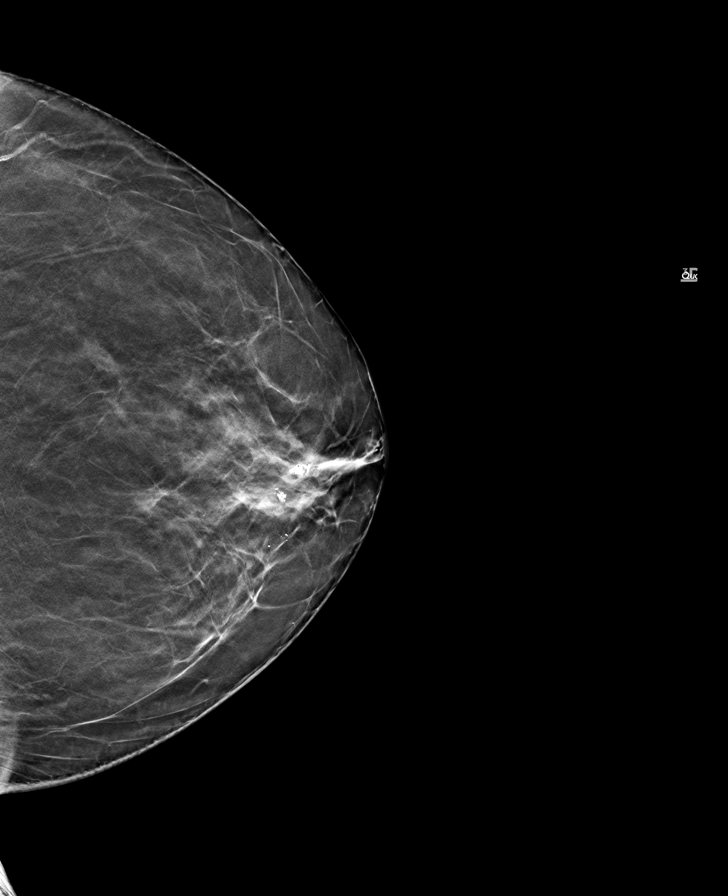

[R CC tomo · tomo slice 13/26.0]
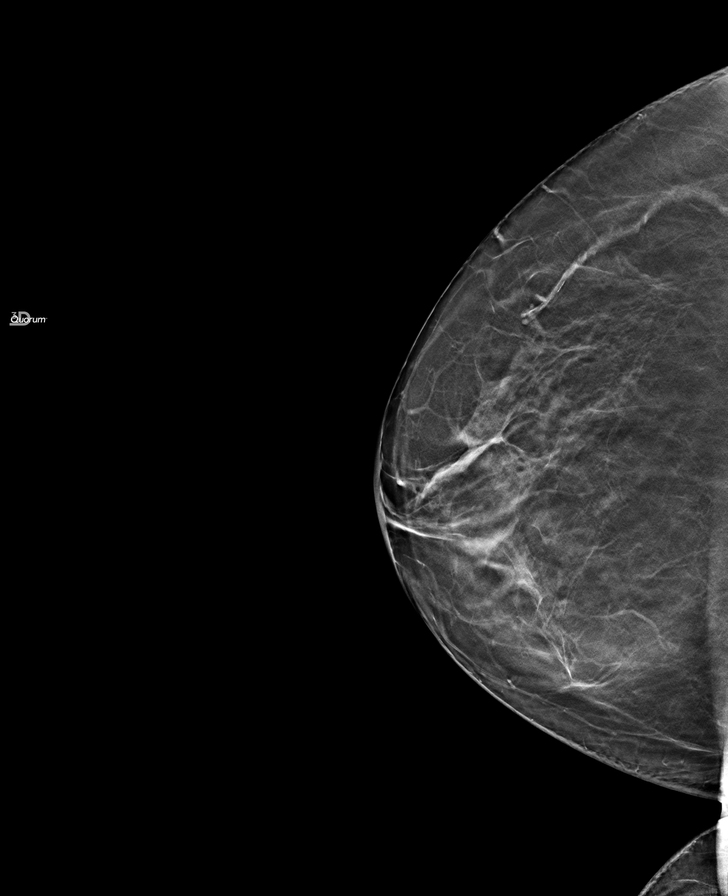

[8 of 24 positions shown; findings below may reference images not displayed]

FINDINGS: Breast parenchymal pattern is unchanged and correlates with patients 
history of prior breast lift. No suspicious mass, calcifications, or area of 
architectural distortion in either breast. Overall stable mammographic 
appearance.
IMPRESSION: No mammographic findings suggestive for malignancy. 
(BI-RADS 2) Benign findings. Routine mammographic follow-up is recommended.
